# Patient Record
Sex: Male | Born: 1975 | Race: White | Hispanic: No | Marital: Married | Smoking: Current every day smoker
Health system: Southern US, Community
[De-identification: ages and names within clinical notes are randomized; demographics above are authoritative.]

## PROBLEM LIST (undated history)

## (undated) DIAGNOSIS — M549 Dorsalgia, unspecified: Secondary | ICD-10-CM

## (undated) DIAGNOSIS — I10 Essential (primary) hypertension: Secondary | ICD-10-CM

## (undated) DIAGNOSIS — F419 Anxiety disorder, unspecified: Secondary | ICD-10-CM

## (undated) DIAGNOSIS — F32A Depression, unspecified: Secondary | ICD-10-CM

## (undated) HISTORY — PX: VASCULAR SURGERY: SHX849

---

## 2020-09-16 ENCOUNTER — Other Ambulatory Visit: Payer: Self-pay

## 2020-09-16 ENCOUNTER — Emergency Department (HOSPITAL_BASED_OUTPATIENT_CLINIC_OR_DEPARTMENT_OTHER): Payer: 59

## 2020-09-16 ENCOUNTER — Emergency Department (HOSPITAL_BASED_OUTPATIENT_CLINIC_OR_DEPARTMENT_OTHER)
Admission: EM | Admit: 2020-09-16 | Discharge: 2020-09-16 | Disposition: A | Discharge status: Home / Self Care | Payer: 59 | Attending: Emergency Medicine | Admitting: Emergency Medicine

## 2020-09-16 ENCOUNTER — Encounter (HOSPITAL_BASED_OUTPATIENT_CLINIC_OR_DEPARTMENT_OTHER): Payer: Self-pay | Admitting: Emergency Medicine

## 2020-09-16 DIAGNOSIS — M25561 Pain in right knee: Secondary | ICD-10-CM

## 2020-09-16 DIAGNOSIS — Y92007 Garden or yard of unspecified non-institutional (private) residence as the place of occurrence of the external cause: Secondary | ICD-10-CM | POA: Diagnosis not present

## 2020-09-16 DIAGNOSIS — Y93H2 Activity, gardening and landscaping: Secondary | ICD-10-CM | POA: Diagnosis not present

## 2020-09-16 DIAGNOSIS — W19XXXA Unspecified fall, initial encounter: Secondary | ICD-10-CM | POA: Insufficient documentation

## 2020-09-16 DIAGNOSIS — F17200 Nicotine dependence, unspecified, uncomplicated: Secondary | ICD-10-CM | POA: Diagnosis not present

## 2020-09-16 DIAGNOSIS — I10 Essential (primary) hypertension: Secondary | ICD-10-CM | POA: Diagnosis not present

## 2020-09-16 HISTORY — DX: Depression, unspecified: F32.A

## 2020-09-16 HISTORY — DX: Essential (primary) hypertension: I10

## 2020-09-16 HISTORY — DX: Anxiety disorder, unspecified: F41.9

## 2020-09-16 MED ORDER — OXYCODONE-ACETAMINOPHEN 5-325 MG PO TABS
1.0000 | ORAL_TABLET | Freq: Once | ORAL | Status: AC
Start: 2020-09-16 — End: 2020-09-16
  Administered 2020-09-16: 1 via ORAL
  Filled 2020-09-16: qty 1

## 2020-09-16 NOTE — ED Triage Notes (Signed)
Fell yesterday and states hurt rt knee , states it bent the wrong way , walks with cane anyway

## 2020-09-16 NOTE — ED Provider Notes (Signed)
[facility] EMERGENCY DEPARTMENT Provider Note   CSN: [id number] Arrival date & time: 09/16/20  5170     History Chief Complaint  Patient presents with  . Fall  . Knee Pain    Larry Hendrix is a 45 y.o. male anxiety, depression, hypertension, chronic low back pain.  Presents with a chief complaint of right knee pain.  Patient reports that she fell yesterday while doing yard work outside.  Patient reports that he felt his right knee hyperextend and felt a "pop."  Patient's pain has been constant since this fall.  Patient rates his pain as a 6/10 on the pain scale.  Patient's pain is worse with ambulation or movement.  Patient denies any alleviating factors.  Patient has been ambulatory since his fall.    He denies any loss of consciousness or hitting his head during fall.  Patient reports history of chronic low back pain.  Denies any new neck or back pain.  Reports "pins-and-needles sensation," in both of his lower legs since having a "surgery on his veins," 2 years prior.  Patient denies any new numbness or tingling to extremities.  Patient denies any weakness extremities.  Patient denies any saddle anesthesia, bowel or bladder dysfunction.    HPI     Past Medical History:  Diagnosis Date  . Anxiety   . Depression   . Hypertension     There are no problems to display for this patient.      No family history on file.  Social History   Tobacco Use  . Smoking status: Current Every Day Smoker  . Smokeless tobacco: Never Used  Substance Use Topics  . Alcohol use: Never  . Drug use: Never    Home Medications Prior to Admission medications   Not on File    Allergies    Patient has no known allergies.  Review of Systems   Review of Systems  Constitutional: Negative for chills and fever.  Cardiovascular: Negative for leg swelling.  Musculoskeletal: Positive for arthralgias and back pain (baseline). Negative for neck pain.  Skin: Negative for color  change and wound.  Neurological: Positive for numbness (baseline). Negative for syncope, weakness, light-headedness and headaches.    Physical Exam Updated Vital Signs BP 135/75   Pulse 78   Temp 98.1 F (36.7 C) (Oral)   Resp 20   Ht 6\' 4"  (1.93 m)   Wt (!) 182.8 kg   SpO2 100%   BMI 49.05 kg/m   Physical Exam Vitals and nursing note reviewed.  Constitutional:      General: He is not in acute distress.    Appearance: He is obese. He is not ill-appearing, toxic-appearing or diaphoretic.  HENT:     Head: Normocephalic and atraumatic.  Eyes:     General: No scleral icterus.       Right eye: No discharge.        Left eye: No discharge.  Cardiovascular:     Rate and Rhythm: Normal rate.  Pulmonary:     Effort: Pulmonary effort is normal.  Musculoskeletal:     Cervical back: Normal range of motion and neck supple. No swelling, edema, deformity, erythema, signs of trauma, rigidity, spasms, torticollis, tenderness or crepitus. No pain with movement, spinous process tenderness or muscular tenderness. Normal range of motion.     Thoracic back: No swelling, deformity, signs of trauma, tenderness or bony tenderness.     Lumbar back: No swelling, edema, deformity, signs of trauma, lacerations, spasms, tenderness  or bony tenderness.     Right upper leg: Normal.     Left upper leg: Normal.     Right knee: No swelling, deformity, effusion, erythema, ecchymosis, lacerations or bony tenderness. Decreased range of motion (decreased flexion). Tenderness present over the lateral joint line. No medial joint line or patellar tendon tenderness. Normal alignment.     Left knee: No swelling, deformity, effusion, erythema, ecchymosis, lacerations or bony tenderness. Normal range of motion. No tenderness. Normal alignment.     Right lower leg: No swelling, deformity, lacerations, tenderness or bony tenderness. No edema.     Right ankle: No swelling, deformity, ecchymosis or lacerations. No tenderness.  Normal range of motion.     Right Achilles Tendon: No tenderness or defects.     Right foot: Normal range of motion and normal capillary refill. No swelling, deformity, laceration, tenderness or bony tenderness. Normal pulse.  Skin:    General: Skin is warm and dry.  Neurological:     General: No focal deficit present.     Mental Status: He is alert.  Psychiatric:        Behavior: Behavior is cooperative.     ED Results / Procedures / Treatments   Labs (all labs ordered are listed, but only abnormal results are displayed) Labs Reviewed - No data to display  EKG None  Radiology DG Knee Complete 4 Views Right  Result Date: 09/16/2020 CLINICAL DATA:  Cough pain EXAM: RIGHT KNEE - COMPLETE 4+ VIEW COMPARISON:  None. FINDINGS: No evidence of fracture, dislocation, or joint effusion. No evidence of arthropathy or other focal bone abnormality. Soft tissues are unremarkable. IMPRESSION: Negative. Electronically Signed   By: Acquanetta Belling M.D.   On: 09/16/2020 10:49    Procedures Procedures   Medications Ordered in ED Medications  oxyCODONE-acetaminophen (PERCOCET/ROXICET) 5-325 MG per tablet 1 tablet (1 tablet Oral Given 09/16/20 1017)    ED Course  I have reviewed the triage vital signs and the nursing notes.  Pertinent labs & imaging results that were available during my care of the patient were reviewed by me and considered in my medical decision making (see chart for details).    MDM Rules/Calculators/A&P                          Alert 45 year old male no acute distress, nontoxic-appearing.  Patient appears uncomfortable and complains of right knee pain.  atient reports that she fell yesterday while doing yard work outside.  Patient reports that he felt his right knee hyperextend and felt a "pop."    On physical exam No swelling, deformity, effusion, erythema, ecchymosis, lacerations or bony tenderness. Decreased range of motion (decreased flexion). Tenderness present over the  lateral joint line. No medial joint line or patellar tendon tenderness. Normal alignment.   No tender spinous process, step-off, deformity, or midline tenderness to cervical, thoracic, or lumbar spine. Low suspicion for C-spine injury; patient cleared via Nexus criteria for CT spine imaging. Low suspicion for intracranial abnormality; patient cleared Via [place] CT head rule.  X-rays of right knee showed no evidence of fracture, dislocation, or joint effusion.  Soft tissues are unremarkable.  No evidence of arthropathy or other focal bone abnormality.  Patient was given Percocet for pain and reports some improvement in his pain symptoms.  Patient placed in knee immobilizer and given crutches.  Patient advised to follow-up with orthopedic physician.  Patient advised to use over-the-counter pain medication as needed for pain management.  Patient given instructions to rest, elevate, and ice knee.  Discussed results, findings, treatment and follow up. Patient advised of return precautions. Patient verbalized understanding and agreed with plan.   Final Clinical Impression(s) / ED Diagnoses Final diagnoses:  Acute pain of right knee    Rx / DC Orders ED Discharge Orders    None       Haskel Schroeder, PA-C 09/16/20 2256    Pricilla Loveless, MD 09/17/20 [phone]

## 2020-09-16 NOTE — Discharge Instructions (Addendum)
You came to the emergency department today to be evaluated for your right knee pain.  Your x-ray showed no signs of dislocations or fractures.  Due to your injury you were placed in a knee immobilizer and given crutches.  Please use your knee immobilizer anytime you are walking.  Please rest, elevate, and ice your knee as much as possible.  Please follow-up with the orthopedic doctor I have given you information for.  Please take Ibuprofen (Advil, motrin) and Tylenol (acetaminophen) to relieve your pain.    You may take up to 600 MG (3 pills) of normal strength ibuprofen every 8 hours as needed.   You make take tylenol, up to 1,000 mg (two extra strength pills) every 8 hours as needed.   It is safe to take ibuprofen and tylenol at the same time as they work differently.   Do not take more than 3,000 mg tylenol in a 24 hour period (not more than one dose every 8 hours.  Please check all medication labels as many medications such as pain and cold medications may contain tylenol.  Do not drink alcohol while taking these medications.  Do not take other NSAID'S while taking ibuprofen (such as aleve or naproxen).  Please take ibuprofen with food to decrease stomach upset.   Get help right away if: Your knee swells, and the swelling becomes worse. You cannot move your knee. You have severe pain in your knee that cannot be managed with pain medicine.

## 2020-11-11 ENCOUNTER — Emergency Department (HOSPITAL_COMMUNITY)
Admission: EM | Admit: 2020-11-11 | Discharge: 2020-11-11 | Disposition: A | Discharge status: Home / Self Care | Payer: 59 | Source: Home / Self Care | Attending: Emergency Medicine | Admitting: Emergency Medicine

## 2020-11-11 ENCOUNTER — Other Ambulatory Visit: Payer: Self-pay

## 2020-11-11 ENCOUNTER — Encounter (HOSPITAL_BASED_OUTPATIENT_CLINIC_OR_DEPARTMENT_OTHER): Payer: Self-pay

## 2020-11-11 DIAGNOSIS — F17200 Nicotine dependence, unspecified, uncomplicated: Secondary | ICD-10-CM | POA: Insufficient documentation

## 2020-11-11 DIAGNOSIS — K029 Dental caries, unspecified: Secondary | ICD-10-CM

## 2020-11-11 DIAGNOSIS — K0889 Other specified disorders of teeth and supporting structures: Secondary | ICD-10-CM

## 2020-11-11 DIAGNOSIS — L03211 Cellulitis of face: Secondary | ICD-10-CM | POA: Diagnosis not present

## 2020-11-11 DIAGNOSIS — I10 Essential (primary) hypertension: Secondary | ICD-10-CM | POA: Insufficient documentation

## 2020-11-11 DIAGNOSIS — A419 Sepsis, unspecified organism: Secondary | ICD-10-CM | POA: Diagnosis not present

## 2020-11-11 MED ORDER — KETOROLAC TROMETHAMINE 30 MG/ML IJ SOLN
60.0000 mg | Freq: Once | INTRAMUSCULAR | Status: AC
Start: 2020-11-11 — End: 2020-11-11
  Administered 2020-11-11: 60 mg via INTRAMUSCULAR
  Filled 2020-11-11: qty 2

## 2020-11-11 MED ORDER — AMOXICILLIN 500 MG PO CAPS: 500.0000 mg | ORAL_CAPSULE | Freq: Three times a day (TID) | ORAL | 0 refills | Status: DC

## 2020-11-11 NOTE — Discharge Instructions (Addendum)
Please pick up prescription and take as prescribed to cover for an infection.  Follow up with your dentist on Monday for further evaluation.  Continue taking Ibuprofen and Tylenol as needed for pain. You can apply cold compresses to the area to help with swelling.   Return to the ED for any worsening symptoms

## 2020-11-11 NOTE — ED Provider Notes (Signed)
[facility] EMERGENCY DEPARTMENT Provider Note   CSN: [id number] Arrival date & time: 11/11/20  0946     History Chief Complaint  Patient presents with  . Dental Pain    Larry Hendrix is a 45 y.o. male with PMhx HTN, anxiety, and depression who presents to the ED today with complaint of gradual onset, constant, sharp, left lower dental pain for the past couple of days.  Patient reports that about 1 year ago the filling out of the same tooth fell out.  It was not bothering him until several days ago.  He also complains of some left-sided facial swelling.  He has an appointment with a dentist on Monday however states the pain is unbearable prompting him to come to the ED today as his PCP could not see him until June.  He has been taking multiple over-the-counter medications including ibuprofen, Tylenol, powder as well as tramadol which she takes for chronic pain.  Patient also had an old prescription for Percocet 10-3 25 which she took last night with mild relief.  He does admit that he is concerned that he could be hooked on narcotics in the future and wants to avoid these at all costs.  He wanted to make sure that he did not require antibiotics today prior to going to see the dentist on Monday as he likely needs his tooth pulled and he knows that they would not pull it if it is infected.  He denies any fevers, chills, foul taste in mouth.  He does report he is having some pain with swallowing however able to tolerate his secretions without difficulty.  No trismus.  No other complaints.   The history is provided by the patient and medical records.       Past Medical History:  Diagnosis Date  . Anxiety   . Depression   . Hypertension     There are no problems to display for this patient.   History reviewed. No pertinent surgical history.     History reviewed. No pertinent family history.  Social History   Tobacco Use  . Smoking status: Current Every Day Smoker  .  Smokeless tobacco: Never Used  Substance Use Topics  . Alcohol use: Never  . Drug use: Never    Home Medications Prior to Admission medications   Medication Sig Start Date End Date Taking? Authorizing Provider  amoxicillin (AMOXIL) 500 MG capsule Take 1 capsule (500 mg total) by mouth 3 (three) times daily. 11/11/20  Yes Hyman Hopes, Virgilio Broadhead, PA-C    Allergies    Patient has no known allergies.  Review of Systems   Review of Systems  Constitutional: Negative for chills and fever.  HENT: Positive for dental problem and facial swelling. Negative for drooling.   All other systems reviewed and are negative.   Physical Exam Updated Vital Signs BP (!) 124/91   Pulse 77   Temp 98.5 F (36.9 C) (Oral)   Resp 16   Ht 6\' 3"  (1.905 m)   Wt (!) 182.8 kg   SpO2 98%   BMI 50.37 kg/m   Physical Exam Vitals and nursing note reviewed.  Constitutional:      Appearance: He is not ill-appearing.  HENT:     Head: Normocephalic and atraumatic.     Mouth/Throat:     Comments: Nose clear.  L lower tooth #19 decayed with caries with TTP, with minimal surrounding gingival swelling and erythema, no definite abscess, no evidence of ludwig's.  Oropharynx clear and  moist, without uvular swelling or deviation, no trismus or drooling, no tonsillar swelling or erythema, no exudates.   Eyes:     Conjunctiva/sclera: Conjunctivae normal.  Cardiovascular:     Rate and Rhythm: Normal rate and regular rhythm.     Pulses: Normal pulses.     Heart sounds: No murmur heard.   Pulmonary:     Effort: Pulmonary effort is normal.     Breath sounds: Normal breath sounds.  Skin:    General: Skin is warm and dry.     Coloration: Skin is not jaundiced.  Neurological:     Mental Status: He is alert.     ED Results / Procedures / Treatments   Labs (all labs ordered are listed, but only abnormal results are displayed) Labs Reviewed - No data to display  EKG None  Radiology No results  found.  Procedures Procedures   Medications Ordered in ED Medications  ketorolac (TORADOL) 30 MG/ML injection 60 mg (60 mg Intramuscular Given 11/11/20 1253)    ED Course  I have reviewed the triage vital signs and the nursing notes.  Pertinent labs & imaging results that were available during my care of the patient were reviewed by me and considered in my medical decision making (see chart for details).    MDM Rules/Calculators/A&P                          45 year old male who presents to the ED today with complaints of left lower dental pain for the past 2 to 3 days.  Has an appointment with a dentist on Monday however continues to have pain and facial swelling on the left side prompting him to come to the ED today.  On arrival to the ED vitals are stable.  Patient appears to be in no acute distress.  He does have some mild left-sided facial swelling without signs of Ludwig's angina at this time.  He also has dental caries noted to tooth #19 with some gingival erythema.  No definitive dental abscess to be drained at this time.  I do feel patient would benefit from antibiotics today.  Have offered 1 dose of pain medication in the ED however patient states that he would like to refrain from narcotics at this time.  We will give a shot of Toradol and discharged.  Patient instructed to follow-up with dentist on Monday.  He is in agreement plan and stable for discharge   This note was prepared using Dragon voice recognition software and may include unintentional dictation errors due to the inherent limitations of voice recognition software.  Final Clinical Impression(s) / ED Diagnoses Final diagnoses:  Pain, dental  Dental caries    Rx / DC Orders ED Discharge Orders         Ordered    amoxicillin (AMOXIL) 500 MG capsule  3 times daily        11/11/20 1256           Discharge Instructions     Please pick up prescription and take as prescribed to cover for an infection.  Follow  up with your dentist on Monday for further evaluation.  Continue taking Ibuprofen and Tylenol as needed for pain. You can apply cold compresses to the area to help with swelling.   Return to the ED for any worsening symptoms       Tanda Rockers, PA-C 11/11/20 1300    Tegeler, Canary Brim, MD 11/11/20 1558

## 2020-11-11 NOTE — ED Triage Notes (Signed)
Pt reports he is supposed to get a tooth pulled on Monday but woke up today and his bottom left jaw was swollen. 10/10 pain.

## 2020-11-12 ENCOUNTER — Inpatient Hospital Stay (HOSPITAL_BASED_OUTPATIENT_CLINIC_OR_DEPARTMENT_OTHER)
Admission: EM | Admit: 2020-11-12 | Discharge: 2020-11-16 | DRG: 872 | Disposition: A | Discharge status: Home / Self Care | Payer: 59 | Attending: Internal Medicine | Admitting: Internal Medicine

## 2020-11-12 ENCOUNTER — Other Ambulatory Visit: Payer: Self-pay

## 2020-11-12 ENCOUNTER — Encounter (HOSPITAL_BASED_OUTPATIENT_CLINIC_OR_DEPARTMENT_OTHER): Payer: Self-pay

## 2020-11-12 ENCOUNTER — Emergency Department (HOSPITAL_BASED_OUTPATIENT_CLINIC_OR_DEPARTMENT_OTHER): Payer: 59

## 2020-11-12 DIAGNOSIS — K219 Gastro-esophageal reflux disease without esophagitis: Secondary | ICD-10-CM | POA: Diagnosis present

## 2020-11-12 DIAGNOSIS — I10 Essential (primary) hypertension: Secondary | ICD-10-CM | POA: Diagnosis present

## 2020-11-12 DIAGNOSIS — M549 Dorsalgia, unspecified: Secondary | ICD-10-CM | POA: Diagnosis present

## 2020-11-12 DIAGNOSIS — K0889 Other specified disorders of teeth and supporting structures: Secondary | ICD-10-CM | POA: Diagnosis present

## 2020-11-12 DIAGNOSIS — F17210 Nicotine dependence, cigarettes, uncomplicated: Secondary | ICD-10-CM | POA: Diagnosis present

## 2020-11-12 DIAGNOSIS — L03211 Cellulitis of face: Secondary | ICD-10-CM | POA: Diagnosis present

## 2020-11-12 DIAGNOSIS — K047 Periapical abscess without sinus: Secondary | ICD-10-CM | POA: Diagnosis present

## 2020-11-12 DIAGNOSIS — G8929 Other chronic pain: Secondary | ICD-10-CM | POA: Diagnosis present

## 2020-11-12 DIAGNOSIS — Z6841 Body Mass Index (BMI) 40.0 and over, adult: Secondary | ICD-10-CM

## 2020-11-12 DIAGNOSIS — Z20822 Contact with and (suspected) exposure to covid-19: Secondary | ICD-10-CM | POA: Diagnosis present

## 2020-11-12 DIAGNOSIS — A419 Sepsis, unspecified organism: Principal | ICD-10-CM | POA: Diagnosis present

## 2020-11-12 DIAGNOSIS — F418 Other specified anxiety disorders: Secondary | ICD-10-CM

## 2020-11-12 DIAGNOSIS — F17200 Nicotine dependence, unspecified, uncomplicated: Secondary | ICD-10-CM | POA: Diagnosis present

## 2020-11-12 DIAGNOSIS — E871 Hypo-osmolality and hyponatremia: Secondary | ICD-10-CM

## 2020-11-12 DIAGNOSIS — K029 Dental caries, unspecified: Secondary | ICD-10-CM | POA: Diagnosis present

## 2020-11-12 LAB — COMPREHENSIVE METABOLIC PANEL
ALT: 32 U/L (ref 0–44)
AST: 19 U/L (ref 15–41)
Albumin: 3.6 g/dL (ref 3.5–5.0)
Alkaline Phosphatase: 71 U/L (ref 38–126)
Anion gap: 10 (ref 5–15)
BUN: 15 mg/dL (ref 6–20)
CO2: 24 mmol/L (ref 22–32)
Calcium: 8.2 mg/dL — ABNORMAL LOW (ref 8.9–10.3)
Chloride: 98 mmol/L (ref 98–111)
Creatinine, Ser: 1.17 mg/dL (ref 0.61–1.24)
GFR, Estimated: >60 mL/min (ref >60)
Glucose, Bld: 133 mg/dL — ABNORMAL HIGH (ref 70–99)
Potassium: 3.8 mmol/L (ref 3.5–5.1)
Sodium: 132 mmol/L — ABNORMAL LOW (ref 135–145)
Total Bilirubin: 1.4 mg/dL — ABNORMAL HIGH (ref 0.3–1.2)
Total Protein: 7.1 g/dL (ref 6.5–8.1)

## 2020-11-12 LAB — CBC WITH DIFFERENTIAL/PLATELET
Abs Immature Granulocytes: 0.06 10*3/uL (ref 0.00–0.07)
Basophils Absolute: 0 10*3/uL (ref 0.0–0.1)
Basophils Relative: 0 %
Eosinophils Absolute: 0 10*3/uL (ref 0.0–0.5)
Eosinophils Relative: 0 %
HCT: 42.8 % (ref 39.0–52.0)
Hemoglobin: 14.5 g/dL (ref 13.0–17.0)
Immature Granulocytes: 0 %
Lymphocytes Relative: 10 %
Lymphs Abs: 1.4 10*3/uL (ref 0.7–4.0)
MCH: 31.2 pg (ref 26.0–34.0)
MCHC: 33.9 g/dL (ref 30.0–36.0)
MCV: 92 fL (ref 80.0–100.0)
Monocytes Absolute: 1 10*3/uL (ref 0.1–1.0)
Monocytes Relative: 7 %
Neutro Abs: 11 10*3/uL — ABNORMAL HIGH (ref 1.7–7.7)
Neutrophils Relative %: 83 %
Platelets: 149 10*3/uL — ABNORMAL LOW (ref 150–400)
RBC: 4.65 MIL/uL (ref 4.22–5.81)
RDW: 12.4 % (ref 11.5–15.5)
WBC: 13.4 10*3/uL — ABNORMAL HIGH (ref 4.0–10.5)
nRBC: 0 % (ref 0.0–0.2)

## 2020-11-12 LAB — URINALYSIS, ROUTINE W REFLEX MICROSCOPIC
Bilirubin Urine: NEGATIVE
Glucose, UA: NEGATIVE mg/dL
Ketones, ur: NEGATIVE mg/dL
Leukocytes,Ua: NEGATIVE
Nitrite: NEGATIVE
Protein, ur: NEGATIVE mg/dL
Specific Gravity, Urine: 1.02 (ref 1.005–1.030)
pH: 6 (ref 5.0–8.0)

## 2020-11-12 LAB — RESP PANEL BY RT-PCR (FLU A&B, COVID) ARPGX2
Influenza A by PCR: NEGATIVE
Influenza B by PCR: NEGATIVE
SARS Coronavirus 2 by RT PCR: NEGATIVE

## 2020-11-12 LAB — PROTIME-INR
INR: 1.2 (ref 0.8–1.2)
Prothrombin Time: 15.1 seconds (ref 11.4–15.2)

## 2020-11-12 LAB — LACTIC ACID, PLASMA: Lactic Acid, Venous: 0.9 mmol/L (ref 0.5–1.9)

## 2020-11-12 LAB — URINALYSIS, MICROSCOPIC (REFLEX): WBC, UA: NONE SEEN WBC/hpf (ref 0–5)

## 2020-11-12 LAB — APTT: aPTT: 34 seconds (ref 24–36)

## 2020-11-12 MED ORDER — LACTATED RINGERS IV BOLUS (SEPSIS)
1000.0000 mL | Freq: Once | INTRAVENOUS | Status: AC
  Administered 2020-11-12: 1000 mL via INTRAVENOUS

## 2020-11-12 MED ORDER — IOHEXOL 300 MG/ML  SOLN
100.0000 mL | Freq: Once | INTRAMUSCULAR | Status: AC
  Administered 2020-11-12: 100 mL via INTRAVENOUS

## 2020-11-12 MED ORDER — HYDROMORPHONE HCL 1 MG/ML IJ SOLN
1.0000 mg | Freq: Once | INTRAMUSCULAR | Status: AC
  Administered 2020-11-12: 1 mg via INTRAVENOUS
  Filled 2020-11-12: qty 1

## 2020-11-12 MED ORDER — SODIUM CHLORIDE 0.9 % IV SOLN
2.0000 g | Freq: Once | INTRAVENOUS | Status: AC
  Administered 2020-11-12: 2 g via INTRAVENOUS
  Filled 2020-11-12: qty 2

## 2020-11-12 MED ORDER — VANCOMYCIN HCL IN DEXTROSE 1-5 GM/200ML-% IV SOLN
1000.0000 mg | Freq: Two times a day (BID) | INTRAVENOUS | Status: AC
Start: 2020-11-12 — End: 2020-11-13
  Administered 2020-11-12 – 2020-11-13 (×2): 1000 mg via INTRAVENOUS
  Filled 2020-11-12 (×4): qty 200

## 2020-11-12 MED ORDER — LACTATED RINGERS IV SOLN: INTRAVENOUS | Status: AC

## 2020-11-12 MED ORDER — VANCOMYCIN HCL IN DEXTROSE 1-5 GM/200ML-% IV SOLN: 1000.0000 mg | Freq: Once | INTRAVENOUS | Status: DC

## 2020-11-12 MED ORDER — VANCOMYCIN HCL IN DEXTROSE 1-5 GM/200ML-% IV SOLN
1000.0000 mg | Freq: Two times a day (BID) | INTRAVENOUS | Status: AC
  Administered 2020-11-12 – 2020-11-13 (×2): 1000 mg via INTRAVENOUS
  Filled 2020-11-12 (×2): qty 200

## 2020-11-12 MED ORDER — MORPHINE SULFATE (PF) 4 MG/ML IV SOLN
4.0000 mg | Freq: Once | INTRAVENOUS | Status: AC
  Administered 2020-11-12: 4 mg via INTRAVENOUS
  Filled 2020-11-12: qty 1

## 2020-11-12 MED ORDER — METRONIDAZOLE 500 MG/100ML IV SOLN
500.0000 mg | Freq: Once | INTRAVENOUS | Status: AC
  Administered 2020-11-12: 500 mg via INTRAVENOUS
  Filled 2020-11-12: qty 100

## 2020-11-12 MED ORDER — SODIUM CHLORIDE 0.9 % IV SOLN
2.0000 g | Freq: Three times a day (TID) | INTRAVENOUS | Status: DC
  Administered 2020-11-13 – 2020-11-15 (×7): 2 g via INTRAVENOUS
  Filled 2020-11-12 (×8): qty 2

## 2020-11-12 MED ORDER — VANCOMYCIN HCL IN DEXTROSE 1-5 GM/200ML-% IV SOLN
1000.0000 mg | Freq: Once | INTRAVENOUS | Status: DC
  Filled 2020-11-12: qty 200

## 2020-11-12 NOTE — ED Notes (Signed)
ED Provider at bedside. 

## 2020-11-12 NOTE — ED Notes (Signed)
Pt is aware he needs a urine sample. Pt has been given a urinal 

## 2020-11-12 NOTE — ED Notes (Signed)
Called to verify Vancomycin order with [facility] that order is for 2 gram.  Order verified.

## 2020-11-12 NOTE — Progress Notes (Signed)
Pharmacy Antibiotic Note  Larry Hendrix is a 45 y.o. male admitted on 11/12/2020 with sepsis.  Pharmacy has been consulted for vancomycin and cefepime dosing.  Plan: Metronidazole 500mg  IV x1 per MD Cefepime 2g IV q8h Vancomycin 2g IV q12h (est AUC 509)  - Monitor renal function - Monitor clinical improvement, LOT, and de-escalation   Height: 6\' 3"  (190.5 cm) Weight: (!) 182.8 kg (403 lb) IBW/kg (Calculated) : 84.5  Temp (24hrs), Avg:101.1 F (38.4 C), Min:101.1 F (38.4 C), Max:101.1 F (38.4 C)  No results for input(s): WBC, CREATININE, LATICACIDVEN, VANCOTROUGH, VANCOPEAK, VANCORANDOM, GENTTROUGH, GENTPEAK, GENTRANDOM, TOBRATROUGH, TOBRAPEAK, TOBRARND, AMIKACINPEAK, AMIKACINTROU, AMIKACIN in the last 168 hours.  CrCl cannot be calculated (No successful lab value found.).    No Known Allergies  Antimicrobials this admission: Metronidazole 4/29 >> Vancomycin 4/29 >> Cefepime 4/29 >>   Dose adjustments this admission:  Microbiology results: 4/29 Bcx: 4/29 Ucx:   Thank you for allowing pharmacy to be a part of this patient's care.  5/29, PharmD PGY1 Acute Care Pharmacy Resident 11/12/2020 7:18 PM  Please check [email or web address] for unit-specific pharmacy phone numbers.
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
## 2020-11-12 NOTE — ED Notes (Signed)
Pt. Went to radiology
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
## 2020-11-13 DIAGNOSIS — K047 Periapical abscess without sinus: Secondary | ICD-10-CM | POA: Diagnosis present

## 2020-11-13 DIAGNOSIS — F418 Other specified anxiety disorders: Secondary | ICD-10-CM | POA: Diagnosis present

## 2020-11-13 DIAGNOSIS — I10 Essential (primary) hypertension: Secondary | ICD-10-CM | POA: Diagnosis present

## 2020-11-13 DIAGNOSIS — K0889 Other specified disorders of teeth and supporting structures: Secondary | ICD-10-CM | POA: Diagnosis present

## 2020-11-13 DIAGNOSIS — E871 Hypo-osmolality and hyponatremia: Secondary | ICD-10-CM

## 2020-11-13 DIAGNOSIS — M549 Dorsalgia, unspecified: Secondary | ICD-10-CM | POA: Diagnosis present

## 2020-11-13 DIAGNOSIS — F17200 Nicotine dependence, unspecified, uncomplicated: Secondary | ICD-10-CM | POA: Diagnosis present

## 2020-11-13 DIAGNOSIS — K029 Dental caries, unspecified: Secondary | ICD-10-CM | POA: Diagnosis present

## 2020-11-13 DIAGNOSIS — G8929 Other chronic pain: Secondary | ICD-10-CM | POA: Diagnosis present

## 2020-11-13 DIAGNOSIS — L03211 Cellulitis of face: Secondary | ICD-10-CM | POA: Diagnosis present

## 2020-11-13 DIAGNOSIS — A419 Sepsis, unspecified organism: Secondary | ICD-10-CM | POA: Diagnosis present

## 2020-11-13 DIAGNOSIS — Z20822 Contact with and (suspected) exposure to covid-19: Secondary | ICD-10-CM | POA: Diagnosis present

## 2020-11-13 DIAGNOSIS — Z6841 Body Mass Index (BMI) 40.0 and over, adult: Secondary | ICD-10-CM | POA: Diagnosis not present

## 2020-11-13 DIAGNOSIS — F17210 Nicotine dependence, cigarettes, uncomplicated: Secondary | ICD-10-CM | POA: Diagnosis present

## 2020-11-13 DIAGNOSIS — K219 Gastro-esophageal reflux disease without esophagitis: Secondary | ICD-10-CM | POA: Diagnosis present

## 2020-11-13 LAB — CBC
HCT: 40.3 % (ref 39.0–52.0)
Hemoglobin: 13.5 g/dL (ref 13.0–17.0)
MCH: 31 pg (ref 26.0–34.0)
MCHC: 33.5 g/dL (ref 30.0–36.0)
MCV: 92.6 fL (ref 80.0–100.0)
Platelets: 126 10*3/uL — ABNORMAL LOW (ref 150–400)
RBC: 4.35 MIL/uL (ref 4.22–5.81)
RDW: 12.2 % (ref 11.5–15.5)
WBC: 7.1 10*3/uL (ref 4.0–10.5)
nRBC: 0 % (ref 0.0–0.2)

## 2020-11-13 LAB — HIV ANTIBODY (ROUTINE TESTING W REFLEX): HIV Screen 4th Generation wRfx: NONREACTIVE

## 2020-11-13 LAB — COMPREHENSIVE METABOLIC PANEL
ALT: 28 U/L (ref 0–44)
AST: 20 U/L (ref 15–41)
Albumin: 3.2 g/dL — ABNORMAL LOW (ref 3.5–5.0)
Alkaline Phosphatase: 62 U/L (ref 38–126)
Anion gap: 7 (ref 5–15)
BUN: 12 mg/dL (ref 6–20)
CO2: 26 mmol/L (ref 22–32)
Calcium: 8.2 mg/dL — ABNORMAL LOW (ref 8.9–10.3)
Chloride: 102 mmol/L (ref 98–111)
Creatinine, Ser: 1.1 mg/dL (ref 0.61–1.24)
GFR, Estimated: >60 mL/min (ref >60)
Glucose, Bld: 129 mg/dL — ABNORMAL HIGH (ref 70–99)
Potassium: 3.8 mmol/L (ref 3.5–5.1)
Sodium: 135 mmol/L (ref 135–145)
Total Bilirubin: 1.1 mg/dL (ref 0.3–1.2)
Total Protein: 6.4 g/dL — ABNORMAL LOW (ref 6.5–8.1)

## 2020-11-13 LAB — HEMOGLOBIN A1C
Hgb A1c MFr Bld: 5.6 % (ref 4.8–5.6)
Mean Plasma Glucose: 114.02 mg/dL

## 2020-11-13 LAB — MAGNESIUM: Magnesium: 2 mg/dL (ref 1.7–2.4)

## 2020-11-13 LAB — LACTIC ACID, PLASMA: Lactic Acid, Venous: 0.8 mmol/L (ref 0.5–1.9)

## 2020-11-13 LAB — PROCALCITONIN: Procalcitonin: 0.57 ng/mL

## 2020-11-13 MED ORDER — CITALOPRAM HYDROBROMIDE 20 MG PO TABS
40.0000 mg | ORAL_TABLET | Freq: Every day | ORAL | Status: DC
  Administered 2020-11-13 – 2020-11-16 (×4): 40 mg via ORAL
  Filled 2020-11-13 (×4): qty 2

## 2020-11-13 MED ORDER — ALBUTEROL SULFATE HFA 108 (90 BASE) MCG/ACT IN AERS
1.0000 | INHALATION_SPRAY | Freq: Four times a day (QID) | RESPIRATORY_TRACT | Status: DC | PRN
Start: 2020-11-13 — End: 2020-11-16
  Filled 2020-11-13: qty 6.7

## 2020-11-13 MED ORDER — ACETAMINOPHEN 650 MG RE SUPP: 650.0000 mg | Freq: Four times a day (QID) | RECTAL | Status: DC | PRN

## 2020-11-13 MED ORDER — HYDROMORPHONE HCL 1 MG/ML IJ SOLN
1.0000 mg | INTRAMUSCULAR | Status: DC | PRN
Start: 2020-11-13 — End: 2020-11-16
  Administered 2020-11-15 (×2): 1 mg via INTRAVENOUS
  Filled 2020-11-13: qty 1

## 2020-11-13 MED ORDER — ENOXAPARIN SODIUM 40 MG/0.4ML IJ SOSY
40.0000 mg | PREFILLED_SYRINGE | INTRAMUSCULAR | Status: DC
  Administered 2020-11-13 – 2020-11-14 (×2): 40 mg via SUBCUTANEOUS
  Filled 2020-11-13 (×2): qty 0.4

## 2020-11-13 MED ORDER — ONDANSETRON HCL 4 MG/2ML IJ SOLN: 4.0000 mg | Freq: Four times a day (QID) | INTRAMUSCULAR | Status: DC | PRN

## 2020-11-13 MED ORDER — ACETAMINOPHEN 325 MG PO TABS
650.0000 mg | ORAL_TABLET | Freq: Four times a day (QID) | ORAL | Status: DC | PRN
  Administered 2020-11-15: 650 mg via ORAL
  Filled 2020-11-13: qty 2

## 2020-11-13 MED ORDER — ACETAMINOPHEN 650 MG RE SUPP: 650.0000 mg | Freq: Once | RECTAL | Status: DC

## 2020-11-13 MED ORDER — PANTOPRAZOLE SODIUM 40 MG PO TBEC
40.0000 mg | DELAYED_RELEASE_TABLET | Freq: Every day | ORAL | Status: DC
  Administered 2020-11-13 – 2020-11-16 (×4): 40 mg via ORAL
  Filled 2020-11-13 (×4): qty 1

## 2020-11-13 MED ORDER — METHOCARBAMOL 500 MG PO TABS
750.0000 mg | ORAL_TABLET | Freq: Four times a day (QID) | ORAL | Status: DC
  Administered 2020-11-13 – 2020-11-16 (×15): 750 mg via ORAL
  Filled 2020-11-13 (×15): qty 2

## 2020-11-13 MED ORDER — VANCOMYCIN HCL 2000 MG/400ML IV SOLN
2000.0000 mg | Freq: Two times a day (BID) | INTRAVENOUS | Status: DC
  Administered 2020-11-13 – 2020-11-15 (×4): 2000 mg via INTRAVENOUS
  Filled 2020-11-13 (×5): qty 400

## 2020-11-13 MED ORDER — PREGABALIN 75 MG PO CAPS
150.0000 mg | ORAL_CAPSULE | Freq: Two times a day (BID) | ORAL | Status: DC
  Administered 2020-11-13 – 2020-11-16 (×7): 150 mg via ORAL
  Filled 2020-11-13 (×7): qty 2

## 2020-11-13 MED ORDER — ONDANSETRON HCL 4 MG PO TABS: 4.0000 mg | ORAL_TABLET | Freq: Four times a day (QID) | ORAL | Status: DC | PRN

## 2020-11-13 MED ORDER — OXYCODONE HCL 5 MG PO TABS
5.0000 mg | ORAL_TABLET | ORAL | Status: DC | PRN
  Administered 2020-11-13 – 2020-11-15 (×6): 5 mg via ORAL
  Filled 2020-11-13 (×6): qty 1

## 2020-11-13 NOTE — H&P (Addendum)
History and Physical    Larry Hendrix FHL:[id number] DOB: [birth year] DOA: 11/12/2020  PCP: Daryll Brod, MD  Patient coming from: [facility] I have personally briefly reviewed patient's old medical records in [place]  Chief Complaint: Left facial cellulitis  HPI: Larry Hendrix is a 45 y.o. male with medical history significant of hypertension, depression with anxiety, chronic back pain, tobacco abuse presents from [place] for further evaluation and management of left facial cellulitis.  He reports that he started having left-sided facial swelling and pain associated with high-grade fever, chills, nausea, vomiting, generalized weakness, lethargy, sore throat, headache.  He was seen at [facility] day before yesterday and was given anti-inflammatory and antibiotics with no improvement.  He decided to go back to [place] due to worsening pain and swelling.  He smokes 1 pack of cigarettes per day, denies alcohol, illicit drug use.  ED Course: Upon arrival to ED he had fever of 101.5, maintaining oxygen saturation on room air, BP: 132/95, CBC shows leukocytosis of 13.4, CMP shows sodium of 132 1.4, lactic acid: WNL.  CT head negative for acute findings.  CT soft tissue concerning for left facial cellulitis.  Patient was given IV fluids, vancomycin, cefepime and metronidazole in ED and transferred to [facility] for further evaluation and management.  Review of Systems: As per HPI otherwise negative.    Past Medical History:  Diagnosis Date  . Anxiety   . Depression   . Hypertension     Past Surgical History:  Procedure Laterality Date  . VASCULAR SURGERY       reports that he has been smoking cigarettes. He has never used smokeless tobacco. He reports that he does not drink alcohol and does not use drugs.  No Known Allergies  No family history on file.  Prior to Admission medications   Medication Sig Start Date End Date Taking?  Authorizing Provider  amoxicillin (AMOXIL) 500 MG capsule Take 1 capsule (500 mg total) by mouth 3 (three) times daily. 11/11/20   Eustaquio Maize, PA-C    Physical Exam: Vitals:   11/13/20 0045 11/13/20 0343 11/13/20 0400 11/13/20 0453  BP: (!) 119/95  (!) 111/55 132/77  Pulse: 80  85 88  Resp: 20  (!) 25 18  Temp:  (!) 101.5 F (38.6 C)  100 F (37.8 C)  TempSrc:  Oral  Oral  SpO2: 100%  92% 95%  Weight:      Height:        Constitutional: NAD, calm, comfortable, on room air, morbidly obese, communicating well, appears weak and sick.   Eyes: PERRL, lids and conjunctivae normal ENMT: Left-sided facial swelling noted.  Tender on palpation, unable to fully open his mouth.  No discharge, bleeding, gum swelling noted. Respiratory: clear to auscultation bilaterally, no wheezing, no crackles. Normal respiratory effort. No accessory muscle use.  Cardiovascular: Regular rate and rhythm, no murmurs / rubs / gallops. No extremity edema. 2+ pedal pulses. No carotid bruits.  Abdomen: no tenderness, no masses palpated. No hepatosplenomegaly. Bowel sounds positive.  Musculoskeletal: no clubbing / cyanosis. No joint deformity upper and lower extremities. Good ROM, no contractures. Normal muscle tone.  Skin: no rashes, lesions, ulcers. No induration Neurologic: CN 2-12 grossly intact. Sensation intact, DTR normal. Strength 5/5 in all 4.  Psychiatric: Normal judgment and insight. Alert and oriented x 3. Normal mood.    Labs on Admission: I have personally reviewed following labs and imaging studies  CBC:  Recent Labs  Lab 11/12/20 1908  WBC 13.4*  NEUTROABS 11.0*  HGB 14.5  HCT 42.8  MCV 92.0  PLT 578*   Basic Metabolic Panel: Recent Labs  Lab 11/12/20 1908  NA 132*  K 3.8  CL 98  CO2 24  GLUCOSE 133*  BUN 15  CREATININE 1.17  CALCIUM 8.2*   GFR: Estimated Creatinine Clearance: 144 mL/min (by C-G formula based on SCr of 1.17 mg/dL). Liver Function Tests: Recent Labs  Lab  11/12/20 1908  AST 19  ALT 32  ALKPHOS 71  BILITOT 1.4*  PROT 7.1  ALBUMIN 3.6   No results for input(s): LIPASE, AMYLASE in the last 168 hours. No results for input(s): AMMONIA in the last 168 hours. Coagulation Profile: Recent Labs  Lab 11/12/20 1908  INR 1.2   Cardiac Enzymes: No results for input(s): CKTOTAL, CKMB, CKMBINDEX, TROPONINI in the last 168 hours. BNP (last 3 results) No results for input(s): PROBNP in the last 8760 hours. HbA1C: No results for input(s): HGBA1C in the last 72 hours. CBG: No results for input(s): GLUCAP in the last 168 hours. Lipid Profile: No results for input(s): CHOL, HDL, LDLCALC, TRIG, CHOLHDL, LDLDIRECT in the last 72 hours. Thyroid Function Tests: No results for input(s): TSH, T4TOTAL, FREET4, T3FREE, THYROIDAB in the last 72 hours. Anemia Panel: No results for input(s): VITAMINB12, FOLATE, FERRITIN, TIBC, IRON, RETICCTPCT in the last 72 hours. Urine analysis:    Component Value Date/Time   COLORURINE YELLOW 11/12/2020 1908   APPEARANCEUR CLEAR 11/12/2020 1908   LABSPEC 1.020 11/12/2020 1908   PHURINE 6.0 11/12/2020 1908   GLUCOSEU NEGATIVE 11/12/2020 1908   HGBUR TRACE (A) 11/12/2020 1908   BILIRUBINUR NEGATIVE 11/12/2020 1908   KETONESUR NEGATIVE 11/12/2020 1908   PROTEINUR NEGATIVE 11/12/2020 1908   NITRITE NEGATIVE 11/12/2020 1908   LEUKOCYTESUR NEGATIVE 11/12/2020 1908    Radiological Exams on Admission: CT Head Wo Contrast  Result Date: 11/12/2020 CLINICAL DATA:  Swelling of the left side of the face EXAM: CT HEAD WITHOUT CONTRAST TECHNIQUE: Contiguous axial images were obtained from the base of the skull through the vertex without intravenous contrast. COMPARISON:  None. FINDINGS: Brain: The brain shows a normal appearance without evidence of malformation, atrophy, old or acute small or large vessel infarction, mass lesion, hemorrhage, hydrocephalus or extra-axial collection. Vascular: No hyperdense vessel. No evidence of  atherosclerotic calcification. Skull: Normal.  No traumatic finding.  No focal bone lesion. Sinuses/Orbits: Sinuses are clear. Orbits appear normal. Mastoids are clear. Other: See results of neck CT. IMPRESSION: 1. Normal head CT. 2. See results of neck CT. Electronically Signed   By: Nelson Chimes M.D.   On: 11/12/2020 20:30   CT Soft Tissue Neck W Contrast  Result Date: 11/12/2020 CLINICAL DATA:  Neck abscess. Lingual induration. Anterior neck swelling. Left molar abscess. EXAM: CT NECK WITH CONTRAST TECHNIQUE: Multidetector CT imaging of the neck was performed using the standard protocol following the bolus administration of intravenous contrast. CONTRAST:  1106mL OMNIPAQUE IOHEXOL 300 MG/ML  SOLN COMPARISON:  None. FINDINGS: Pharynx and larynx: No primary mucosal or submucosal lesion identified. Tongue appears grossly normal by CT. Salivary glands: Parotid and submandibular glands are normal. Thyroid: Normal Lymph nodes: Mild reactive prominence of the lymph nodes on the left. No suppuration. Vascular: Normal Limited intracranial: Normal Visualized orbits: Normal Mastoids and visualized paranasal sinuses: Clear Skeleton: Maxillary dentition appears normal. Mandibular dentition appears normal except for to 19 which shows a filling with some adjacent lucency. No evidence of  root disease. Upper chest: Negative Other: Left facial edema consistent with facial cellulitis. Swelling in the region of the gingiva adjacent to the left mandible. As noted above, there does not appear to be advanced periodontal disease by imaging however. No sign of a drainable low-density collection. IMPRESSION: Left facial cellulitis. Swelling in the region of the gingiva adjacent to the left mandible. No sign of a drainable low-density collection. No evidence of advanced periodontal disease. Dental filling tooth 19 with some adjacent lucency. Electronically Signed   By: Nelson Chimes M.D.   On: 11/12/2020 20:30   DG Chest Port 1  View  Result Date: 11/12/2020 CLINICAL DATA:  Questionable sepsis. EXAM: PORTABLE CHEST 1 VIEW COMPARISON:  Chest x-ray dated 04/22/2020 FINDINGS: Borderline cardiomegaly, stable. Lungs appear clear. No pleural effusion or pneumothorax is seen. Osseous structures about the chest are unremarkable. IMPRESSION: No active disease. No evidence of pneumonia or pulmonary edema. Electronically Signed   By: Franki Cabot M.D.   On: 11/12/2020 20:44     Assessment/Plan Principal Problem:   Sepsis ([place]) Active Problems:   Facial cellulitis   Hypertension   Tobacco use disorder   Morbid obesity ([place])   Hyponatremia   Depression with anxiety   Sepsis in the setting of left facial cellulitis: -Patient met sepsis criteria on admission with fever of 101 and leukocytosis of 13.4.  Lactic acid: WNL, COVID-19 negative, -Reviewed CT head, CT soft tissue and chest x-ray. -Patient started on IV fluids, cefepime, metronidazole and vancomycin -Continue IV fluids, cefepime and vancomycin. -Blood culture is obtained and is pending.  Check A1c.  Repeat labs. -Continue as needed pain medications -We will keep him n.p.o.  On continuous pulse ox.  Hypertension: Currently blood pressure is stable to lower side. -Continue to hold antihypertensive medication-amlodipine, Coreg, HCTZ, losartan at this time.  Continue IV fluids.  Monitor blood pressure closely  Depression with anxiety: Continue Celexa  Chronic back pain: Continue Robaxin and Lyrica  GERD: Continue PPI  Hyponatremia: Sodium 132. -Continue IV fluids.  Repeat BMP  Tobacco abuse: -Smokes 1 pack of cigarettes per day.  Counseled about cessation.  Refused nicotine patch.  Morbid obesity with BMI of 50: -Diet modification/exercise and weight loss recommended  DVT prophylaxis: Lovenox Code Status: SCD Family Communication: None present at bedside.  Plan of care discussed with patient in length and he verbalized understanding and agreed with  it. Disposition Plan: To be determined Consults called: None Admission status: Inpatient   Mckinley Jewel MD [facility]  If 7PM-7AM, please contact night-coverage [email or web address]  11/13/2020, 9:17 AM

## 2020-11-13 NOTE — Plan of Care (Signed)
  Problem: Clinical Measurements: Goal: Ability to maintain clinical measurements within normal limits will improve Outcome: Progressing Goal: Will remain free from infection Outcome: Progressing Goal: Respiratory complications will improve Outcome: Progressing   

## 2020-11-13 NOTE — Plan of Care (Signed)
?  Problem: Activity: ?Goal: Risk for activity intolerance will decrease ?Outcome: Progressing ?  ?Problem: Safety: ?Goal: Ability to remain free from injury will improve ?Outcome: Progressing ?  ?Problem: Pain Managment: ?Goal: General experience of comfort will improve ?Outcome: Progressing ?  ?

## 2020-11-13 NOTE — Progress Notes (Signed)
Code Sepsis completion note: Pt had BC drawn before ABX administered. LA 0.9. Received approx 1600 ml of allotted 5,400 +. Sepsis due to tooth abscess with inflammation that spread to the neck.

## 2020-11-14 DIAGNOSIS — F418 Other specified anxiety disorders: Secondary | ICD-10-CM

## 2020-11-14 DIAGNOSIS — L03211 Cellulitis of face: Secondary | ICD-10-CM

## 2020-11-14 DIAGNOSIS — I10 Essential (primary) hypertension: Secondary | ICD-10-CM

## 2020-11-14 DIAGNOSIS — A419 Sepsis, unspecified organism: Principal | ICD-10-CM

## 2020-11-14 DIAGNOSIS — F17200 Nicotine dependence, unspecified, uncomplicated: Secondary | ICD-10-CM

## 2020-11-14 LAB — CBC
HCT: 41.1 % (ref 39.0–52.0)
Hemoglobin: 13.6 g/dL (ref 13.0–17.0)
MCH: 30.8 pg (ref 26.0–34.0)
MCHC: 33.1 g/dL (ref 30.0–36.0)
MCV: 93.2 fL (ref 80.0–100.0)
Platelets: 132 10*3/uL — ABNORMAL LOW (ref 150–400)
RBC: 4.41 MIL/uL (ref 4.22–5.81)
RDW: 12 % (ref 11.5–15.5)
WBC: 6.5 10*3/uL (ref 4.0–10.5)
nRBC: 0 % (ref 0.0–0.2)

## 2020-11-14 LAB — PROCALCITONIN: Procalcitonin: 0.43 ng/mL

## 2020-11-14 LAB — CORTISOL-AM, BLOOD: Cortisol - AM: 12.2 ug/dL (ref 6.7–22.6)

## 2020-11-14 LAB — URINE CULTURE: Culture: NO GROWTH

## 2020-11-14 LAB — PROTIME-INR
INR: 1.2 (ref 0.8–1.2)
Prothrombin Time: 15.3 seconds — ABNORMAL HIGH (ref 11.4–15.2)

## 2020-11-14 NOTE — Progress Notes (Addendum)
PROGRESS NOTE    Larry Hendrix  WLS:[id number] DOB: 1975/10/18 DOA: 11/12/2020 PCP: Daryll Brod, MD   Brief Narrative:  Larry Hendrix is a 45 y.o. male with medical history significant of hypertension, depression with anxiety, chronic back pain, tobacco abuse presents from [facility] for further evaluation and management of left facial cellulitis.  ED Course: Upon arrival to ED he had fever of 101.5, maintaining oxygen saturation on room air, BP: 132/95, CBC shows leukocytosis of 13.4, CMP shows sodium of 132 1.4, lactic acid: WNL.  CT head negative for acute findings.  CT soft tissue concerning for left facial cellulitis.  Patient was given IV fluids, vancomycin, cefepime and metronidazole in ED and transferred to [facility] for further evaluation and management.  Assessment & Plan:   Sepsis in the setting of left facial cellulitis: -Patient met sepsis criteria on admission with fever of 101 and leukocytosis of 13.4.  Lactic acid: WNL, COVID-19 negative, -Reviewed CT head, CT soft tissue and chest x-ray. -Continue IV antibiotics cefepime and vancomycin. -Blood culture is NTD.    A1c: 5.6. procalcitonin trended down from 0.57-0.43 -Continue as needed pain medications -Remained afebrile overnight.  Leukocytosis resolved.  Started on clear liquid diet  Hypertension:blood pressure is stable to lower side. -Continue to hold antihypertensive medication-amlodipine, Coreg, HCTZ, losartan at this time.    Monitor blood pressure closely -Resume home blood pressure medicines once blood pressure is back to baseline.  Depression with anxiety: Continue Celexa  Chronic back pain: Continue Robaxin and Lyrica  GERD: Continue PPI  Hyponatremia:  Resolved  Tobacco abuse: -Smokes 1 pack of cigarettes per day.  Counseled about cessation.  Refused nicotine patch.  Morbid obesity with BMI of 50: -Diet modification/exercise and weight loss recommended   DVT  prophylaxis: Lovenox Code Status: Full code Family Communication:  None present at bedside.  Plan of care discussed with patient in length and he verbalized understanding and agreed with it. Disposition Plan: Likely home in 1 to 2 days  Consultants:   None  Procedures:   None  Antimicrobials:   Cefepime  Vancomycin  Status is: Inpatient  Dispo: The patient is from: Home              Anticipated d/c is to: Home              Patient currently is not medically stable to d/c.   Difficult to place patient No         Subjective: Patient seen and examined.  Resting comfortably on the bed.  Reports improvement in left facial swelling and pain.  Remained afebrile.  Tells me that he is able to open his mouth now and feels much better.  Objective: Vitals:   11/14/20 0039 11/14/20 0300 11/14/20 0458 11/14/20 0750  BP: 127/74 128/62 128/66 117/79  Pulse: 79 70 69 70  Resp: $Remo'16 17 16 17  'HlSAw$ Temp: 98.5 F (36.9 C) 98.8 F (37.1 C) 98 F (36.7 C) 98.7 F (37.1 C)  TempSrc: Axillary Axillary Axillary Oral  SpO2: 95% 97% 97% 96%  Weight:      Height:        Intake/Output Summary (Last 24 hours) at 11/14/2020 0903 Last data filed at 11/14/2020 0458 Gross per 24 hour  Intake 361.97 ml  Output 1100 ml  Net -738.03 ml   Filed Weights   11/12/20 1816  Weight: (!) 182.8 kg    Examination:  General exam: Appears calm and comfortable, on room air, communicating  well, morbidly obese HEENT: Left-sided facial swelling noted, tender on palpation, nonerythematous, able to open his mouth as compared to yesterday HeRespiratory system: Clear to auscultation. Respiratory effort normal. Cardiovascular system: S1 & S2 heard, RRR. No JVD, murmurs, rubs, gallops or clicks. No pedal edema. Gastrointestinal system: Abdomen is nondistended, soft and nontender. No organomegaly or masses felt. Normal bowel sounds heard. Central nervous system: Alert and oriented. No focal neurological  deficits. Extremities: Symmetric 5 x 5 power. Skin: No rashes, lesions or ulcers Psychiatry: Judgement and insight appear normal. Mood & affect appropriate.    Data Reviewed: I have personally reviewed following labs and imaging studies  CBC: Recent Labs  Lab 11/12/20 1908 11/13/20 0944 11/14/20 0056  WBC 13.4* 7.1 6.5  NEUTROABS 11.0*  --   --   HGB 14.5 13.5 13.6  HCT 42.8 40.3 41.1  MCV 92.0 92.6 93.2  PLT 149* 126* 132*   Basic Metabolic Panel: Recent Labs  Lab 11/12/20 1908 11/13/20 0944  NA 132* 135  K 3.8 3.8  CL 98 102  CO2 24 26  GLUCOSE 133* 129*  BUN 15 12  CREATININE 1.17 1.10  CALCIUM 8.2* 8.2*  MG  --  2.0   GFR: Estimated Creatinine Clearance: 153.2 mL/min (by C-G formula based on SCr of 1.1 mg/dL). Liver Function Tests: Recent Labs  Lab 11/12/20 1908 11/13/20 0944  AST 19 20  ALT 32 28  ALKPHOS 71 62  BILITOT 1.4* 1.1  PROT 7.1 6.4*  ALBUMIN 3.6 3.2*   No results for input(s): LIPASE, AMYLASE in the last 168 hours. No results for input(s): AMMONIA in the last 168 hours. Coagulation Profile: Recent Labs  Lab 11/12/20 1908 11/14/20 0056  INR 1.2 1.2   Cardiac Enzymes: No results for input(s): CKTOTAL, CKMB, CKMBINDEX, TROPONINI in the last 168 hours. BNP (last 3 results) No results for input(s): PROBNP in the last 8760 hours. HbA1C: Recent Labs    11/13/20 0944  HGBA1C 5.6   CBG: No results for input(s): GLUCAP in the last 168 hours. Lipid Profile: No results for input(s): CHOL, HDL, LDLCALC, TRIG, CHOLHDL, LDLDIRECT in the last 72 hours. Thyroid Function Tests: No results for input(s): TSH, T4TOTAL, FREET4, T3FREE, THYROIDAB in the last 72 hours. Anemia Panel: No results for input(s): VITAMINB12, FOLATE, FERRITIN, TIBC, IRON, RETICCTPCT in the last 72 hours. Sepsis Labs: Recent Labs  Lab 11/12/20 1908 11/13/20 0557 11/13/20 0944 11/14/20 0056  PROCALCITON  --   --  0.57 0.43  LATICACIDVEN 0.9 0.8  --   --      Recent Results (from the past 240 hour(s))  Blood Culture (routine x 2)     Status: None (Preliminary result)   Collection Time: 11/12/20  6:53 PM   Specimen: BLOOD RIGHT ARM  Result Value Ref Range Status   Specimen Description   Final    BLOOD RIGHT ARM Performed at [facility], [address] [place], [place] [address]    Special Requests   Final    BOTTLES DRAWN AEROBIC AND ANAEROBIC Blood Culture adequate volume Performed at [facility], [address] [place], [place] [address]    Culture   Final    NO GROWTH < 24 HOURS Performed at [facility], [address], [place], [place] [address]    Report Status PENDING  Incomplete  Resp Panel by RT-PCR (Flu A&B, Covid) Nasopharyngeal Swab     Status: None   Collection Time: 11/12/20  7:08 PM   Specimen: Nasopharyngeal Swab; Nasopharyngeal(NP) swabs in vial transport medium  Result Value Ref Range Status   SARS Coronavirus 2 by RT PCR NEGATIVE NEGATIVE Final    Comment: (NOTE) SARS-CoV-2 target nucleic acids are NOT DETECTED.  The SARS-CoV-2 RNA is generally detectable in upper respiratory specimens during the acute phase of infection. The lowest concentration of SARS-CoV-2 viral copies this assay can detect is 138 copies/mL. A negative result does not preclude SARS-Cov-2 infection and should not be used as the sole basis for treatment or other patient management decisions. A negative result may occur with  improper specimen collection/handling, submission of specimen other than nasopharyngeal swab, presence of viral mutation(s) within the areas targeted by this assay, and inadequate number of viral copies(<138 copies/mL). A negative result must be combined with clinical observations, patient history, and epidemiological information. The expected result is Negative.  Fact Sheet for Patients:  EntrepreneurPulse.com.au  Fact Sheet for Healthcare Providers:   IncredibleEmployment.be  This test is no t yet approved or cleared by the [place] FDA and  has been authorized for detection and/or diagnosis of SARS-CoV-2 by FDA under an Emergency Use Authorization (EUA). This EUA will remain  in effect (meaning this test can be used) for the duration of the COVID-19 declaration under Section 564(b)(1) of the Act, 21 U.S.C.section 360bbb-3(b)(1), unless the authorization is terminated  or revoked sooner.       Influenza A by PCR NEGATIVE NEGATIVE Final   Influenza B by PCR NEGATIVE NEGATIVE Final    Comment: (NOTE) The Xpert Xpress SARS-CoV-2/FLU/RSV plus assay is intended as an aid in the diagnosis of influenza from Nasopharyngeal swab specimens and should not be used as a sole basis for treatment. Nasal washings and aspirates are unacceptable for Xpert Xpress SARS-CoV-2/FLU/RSV testing.  Fact Sheet for Patients: EntrepreneurPulse.com.au  Fact Sheet for Healthcare Providers: IncredibleEmployment.be  This test is not yet approved or cleared by the [place] FDA and has been authorized for detection and/or diagnosis of SARS-CoV-2 by FDA under an Emergency Use Authorization (EUA). This EUA will remain in effect (meaning this test can be used) for the duration of the COVID-19 declaration under Section 564(b)(1) of the Act, 21 U.S.C. section 360bbb-3(b)(1), unless the authorization is terminated or revoked.  Performed at [facility], [address] [place], [place] [address]   Urine culture     Status: None   Collection Time: 11/12/20  7:08 PM   Specimen: In/Out Cath Urine  Result Value Ref Range Status   Specimen Description   Final    IN/OUT CATH URINE Performed at [facility], [address] [place], [place] [address]    Special Requests   Final    NONE Performed at [facility], [address] [place], [place] [address]    Culture    Final    NO GROWTH Performed at [facility], [place] [address] [place], [place] [address]    Report Status 11/14/2020 FINAL  Final  Blood Culture (routine x 2)     Status: None (Preliminary result)   Collection Time: 11/12/20  7:20 PM   Specimen: BLOOD RIGHT HAND  Result Value Ref Range Status   Specimen Description   Final    BLOOD RIGHT HAND Performed at [facility], [address] [place], [place] [address]    Special Requests   Final    BOTTLES DRAWN AEROBIC AND ANAEROBIC Blood  Culture adequate volume Performed at [facility], [address] [place], [place] [address]    Culture   Final    NO GROWTH < 24 HOURS Performed at [facility], [place] [address], [place], [place] [address]    Report Status PENDING  Incomplete      Radiology Studies: CT Head Wo Contrast  Result Date: 11/12/2020 CLINICAL DATA:  Swelling of the left side of the face EXAM: CT HEAD WITHOUT CONTRAST TECHNIQUE: Contiguous axial images were obtained from the base of the skull through the vertex without intravenous contrast. COMPARISON:  None. FINDINGS: Brain: The brain shows a normal appearance without evidence of malformation, atrophy, old or acute small or large vessel infarction, mass lesion, hemorrhage, hydrocephalus or extra-axial collection. Vascular: No hyperdense vessel. No evidence of atherosclerotic calcification. Skull: Normal.  No traumatic finding.  No focal bone lesion. Sinuses/Orbits: Sinuses are clear. Orbits appear normal. Mastoids are clear. Other: See results of neck CT. IMPRESSION: 1. Normal head CT. 2. See results of neck CT. Electronically Signed   By: Nelson Chimes M.D.   On: 11/12/2020 20:30   CT Soft Tissue Neck W Contrast  Result Date: 11/12/2020 CLINICAL DATA:  Neck abscess. Lingual induration. Anterior neck swelling. Left molar abscess. EXAM: CT NECK WITH CONTRAST TECHNIQUE: Multidetector CT imaging of the neck was performed using the standard  protocol following the bolus administration of intravenous contrast. CONTRAST:  1103mL OMNIPAQUE IOHEXOL 300 MG/ML  SOLN COMPARISON:  None. FINDINGS: Pharynx and larynx: No primary mucosal or submucosal lesion identified. Tongue appears grossly normal by CT. Salivary glands: Parotid and submandibular glands are normal. Thyroid: Normal Lymph nodes: Mild reactive prominence of the lymph nodes on the left. No suppuration. Vascular: Normal Limited intracranial: Normal Visualized orbits: Normal Mastoids and visualized paranasal sinuses: Clear Skeleton: Maxillary dentition appears normal. Mandibular dentition appears normal except for to 19 which shows a filling with some adjacent lucency. No evidence of root disease. Upper chest: Negative Other: Left facial edema consistent with facial cellulitis. Swelling in the region of the gingiva adjacent to the left mandible. As noted above, there does not appear to be advanced periodontal disease by imaging however. No sign of a drainable low-density collection. IMPRESSION: Left facial cellulitis. Swelling in the region of the gingiva adjacent to the left mandible. No sign of a drainable low-density collection. No evidence of advanced periodontal disease. Dental filling tooth 19 with some adjacent lucency. Electronically Signed   By: Nelson Chimes M.D.   On: 11/12/2020 20:30   DG Chest Port 1 View  Result Date: 11/12/2020 CLINICAL DATA:  Questionable sepsis. EXAM: PORTABLE CHEST 1 VIEW COMPARISON:  Chest x-ray dated 04/22/2020 FINDINGS: Borderline cardiomegaly, stable. Lungs appear clear. No pleural effusion or pneumothorax is seen. Osseous structures about the chest are unremarkable. IMPRESSION: No active disease. No evidence of pneumonia or pulmonary edema. Electronically Signed   By: Franki Cabot M.D.   On: 11/12/2020 20:44    Scheduled Meds: . acetaminophen  650 mg Rectal Once  . citalopram  40 mg Oral Daily  . enoxaparin (LOVENOX) injection  40 mg Subcutaneous Q24H   . methocarbamol  750 mg Oral QID  . pantoprazole  40 mg Oral Daily  . pregabalin  150 mg Oral BID   Continuous Infusions: . ceFEPime (MAXIPIME) IV 2 g (11/14/20 0302)  . vancomycin 2,000 mg (11/14/20 0827)     LOS: 1 day   Time spent: 35 minutes  Lashay Osborne Loann Quill, MD [facility]  If 7PM-7AM, please contact night-coverage [email or web address] 11/14/2020, 9:03 AM

## 2020-11-14 NOTE — Plan of Care (Signed)
  Problem: Education: Goal: Knowledge of General Education information will improve Description: Including pain rating scale, medication(s)/side effects and non-pharmacologic comfort measures Outcome: Progressing   Problem: Health Behavior/Discharge Planning: Goal: Ability to manage health-related needs will improve Outcome: Progressing   Problem: Clinical Measurements: Goal: Ability to maintain clinical measurements within normal limits will improve Outcome: Progressing   Problem: Activity: Goal: Risk for activity intolerance will decrease Outcome: Progressing   Problem: Nutrition: Goal: Adequate nutrition will be maintained Outcome: Progressing   Problem: Elimination: Goal: Will not experience complications related to urinary retention Outcome: Progressing   Problem: Pain Managment: Goal: General experience of comfort will improve Outcome: Progressing   Problem: Safety: Goal: Ability to remain free from injury will improve Outcome: Progressing   

## 2020-11-15 DIAGNOSIS — E871 Hypo-osmolality and hyponatremia: Secondary | ICD-10-CM

## 2020-11-15 LAB — CBC
HCT: 40.8 % (ref 39.0–52.0)
Hemoglobin: 13.3 g/dL (ref 13.0–17.0)
MCH: 30.7 pg (ref 26.0–34.0)
MCHC: 32.6 g/dL (ref 30.0–36.0)
MCV: 94.2 fL (ref 80.0–100.0)
Platelets: 152 10*3/uL (ref 150–400)
RBC: 4.33 MIL/uL (ref 4.22–5.81)
RDW: 12 % (ref 11.5–15.5)
WBC: 5.4 10*3/uL (ref 4.0–10.5)
nRBC: 0 % (ref 0.0–0.2)

## 2020-11-15 LAB — BASIC METABOLIC PANEL
Anion gap: 8 (ref 5–15)
BUN: 12 mg/dL (ref 6–20)
CO2: 28 mmol/L (ref 22–32)
Calcium: 8.7 mg/dL — ABNORMAL LOW (ref 8.9–10.3)
Chloride: 102 mmol/L (ref 98–111)
Creatinine, Ser: 1.22 mg/dL (ref 0.61–1.24)
GFR, Estimated: >60 mL/min (ref >60)
Glucose, Bld: 96 mg/dL (ref 70–99)
Potassium: 5 mmol/L (ref 3.5–5.1)
Sodium: 138 mmol/L (ref 135–145)

## 2020-11-15 LAB — PROCALCITONIN: Procalcitonin: 0.26 ng/mL

## 2020-11-15 MED ORDER — ENOXAPARIN SODIUM 100 MG/ML IJ SOSY
90.0000 mg | PREFILLED_SYRINGE | Freq: Every day | INTRAMUSCULAR | Status: DC
  Administered 2020-11-15 – 2020-11-16 (×2): 90 mg via SUBCUTANEOUS
  Filled 2020-11-15 (×2): qty 0.9

## 2020-11-15 MED ORDER — METRONIDAZOLE 500 MG/100ML IV SOLN: 500.0000 mg | Freq: Three times a day (TID) | INTRAVENOUS | Status: DC

## 2020-11-15 MED ORDER — SODIUM CHLORIDE 0.9 % IV SOLN
3.0000 g | Freq: Four times a day (QID) | INTRAVENOUS | Status: DC
  Administered 2020-11-15 – 2020-11-16 (×5): 3 g via INTRAVENOUS
  Filled 2020-11-15 (×8): qty 8

## 2020-11-15 NOTE — Progress Notes (Addendum)
PROGRESS NOTE  Larry Hendrix WUJ:[id number] DOB: 1976/03/14 DOA: 11/12/2020 PCP: Ofilia NeasSpivey, Steven J, MD   LOS: 2 days   Brief narrative: Larry Hendrix a 45 y.o.malewith medical history significant for history ofhypertension, depression with anxiety, chronic back pain, tobacco abuse was transferred from [facility] with left facial swelling edema fever and progressive pain.  In the ED, patient was febrile with a temperature of 101 F with leukocytosis at 13.4.  CT head was negative but soft tissue was concerning for left facial cellulitis.  Patient was given vancomycin and cefepime and metronidazole in the ED and was [facility] evaluation and treatment   Assessment/Plan:  Principal Problem:   Sepsis (HCC) Active Problems:   Facial cellulitis   Hypertension   Tobacco use disorder   Morbid obesity (HCC)   Hyponatremia   Depression with anxiety  Sepsis secondary to left facial cellulitis: Likely odontogenic in nature.  He was supposed to extract his teeth as outpatient.  He was advised to follow-up with his dentist after discharge.  Blood cultures negative so far.  On cefepime and vancomycin.  Add metronidazole for now.  Afebrile.  Continue soft diet as tolerated.  Blood cultures negative in 3 days.  Pro-Cal 0.2.  Still with significant swelling and edema.  Patient had failed outpatient amoxicillin treatment.  We will continue IV antibiotic for at least 1 more day.  Reassess in a.m.   Essential hypertension Continue Coreg, HCTZ, losartan.  Blood pressure stable at this time.  Depression with anxiety: Continue Celexa  Chronic back pain: Continue Robaxin and Lyrica  GERD:Continue PPI  Hyponatremia: Resolved, latest sodium 138  Tobacco abuse: A pack of cigarettes a day.  Refused nicotine patch  Morbid obesity with BMI of 50: Will recommend lifestyle modification    DVT prophylaxis: SCDs Start: 11/13/20 0851    Addendum:  11/15/2020 2:09 PM  Spoke with pharmacy.  We will change the antibiotic to Zosyn at this time instead of Vanco cefepime and Flagyl.  Code Status: Full code  Family Communication: None  Status is: Inpatient  Remains inpatient appropriate because:IV treatments appropriate due to intensity of illness or inability to take PO and Inpatient level of care appropriate due to severity of illness   Dispo: The patient is from: Home              Anticipated d/c is to: Home likely by tomorrow, or in 1 to 2 days if continues to improve.              Patient currently is not medically stable to d/c.   Difficult to place patient No   Consultants:  None  Procedures:  None  Anti-infectives:  Marland Kitchen. Vancomycin and cefepime  Anti-infectives (From admission, onward)   Start     Dose/Rate Route Frequency Ordered Stop   11/13/20 2030  vancomycin (VANCOREADY) IVPB 2000 mg/400 mL        2,000 mg 200 mL/hr over 120 Minutes Intravenous Every 12 hours 11/13/20 0853     11/13/20 0300  ceFEPIme (MAXIPIME) 2 g in sodium chloride 0.9 % 100 mL IVPB        2 g 200 mL/hr over 30 Minutes Intravenous Every 8 hours 11/12/20 1947     11/12/20 2000  vancomycin (VANCOCIN) IVPB 1000 mg/200 mL premix       "And" Linked Group Details   1,000 mg 200 mL/hr over 60 Minutes Intravenous Every 12 hours 11/12/20 1948 11/13/20 0931  11/12/20 2000  vancomycin (VANCOCIN) IVPB 1000 mg/200 mL premix       "And" Linked Group Details   1,000 mg 200 mL/hr over 60 Minutes Intravenous Every 12 hours 11/12/20 1948 11/13/20 1059   11/12/20 1930  vancomycin (VANCOCIN) IVPB 1000 mg/200 mL premix  Status:  Discontinued       "And" Linked Group Details   1,000 mg 200 mL/hr over 60 Minutes Intravenous  Once 11/12/20 1915 11/12/20 1946   11/12/20 1930  vancomycin (VANCOCIN) IVPB 1000 mg/200 mL premix  Status:  Discontinued       "And" Linked Group Details   1,000 mg 200 mL/hr over 60 Minutes Intravenous  Once 11/12/20 1915  11/12/20 1946   11/12/20 1915  ceFEPIme (MAXIPIME) 2 g in sodium chloride 0.9 % 100 mL IVPB        2 g 200 mL/hr over 30 Minutes Intravenous  Once 11/12/20 1904 11/12/20 2012   11/12/20 1915  metroNIDAZOLE (FLAGYL) IVPB 500 mg        500 mg 100 mL/hr over 60 Minutes Intravenous  Once 11/12/20 1904 11/12/20 2157   11/12/20 1915  vancomycin (VANCOCIN) IVPB 1000 mg/200 mL premix  Status:  Discontinued        1,000 mg 200 mL/hr over 60 Minutes Intravenous  Once 11/12/20 1904 11/12/20 1915       Subjective: Today, patient was seen and examined at bedside.  Complains of mild left cheek swelling.  No fever or chills.  Still has difficulty with chewing and significant swelling.  Objective: Vitals:   11/15/20 0241 11/15/20 0840  BP: (!) 135/92 138/89  Pulse: 64 (!) 58  Resp: 19 20  Temp: 98.4 F (36.9 C) 98.2 F (36.8 C)  SpO2: 97% 96%    Intake/Output Summary (Last 24 hours) at 11/15/2020 1011 Last data filed at 11/15/2020 0750 Gross per 24 hour  Intake 640 ml  Output 2100 ml  Net -1460 ml   Filed Weights   11/12/20 1816  Weight: (!) 182.8 kg   Body mass index is 50.37 kg/m.   Physical Exam: GENERAL: Patient is alert awake and oriented. Not in obvious distress.  Morbidly obese HENT: No scleral pallor or icterus. Pupils equally reactive to light. Oral mucosa is moist.  Left facial swelling with tenderness and induration, mild trismus noted.  Left submandibular induration and swelling as well. NECK: is supple, CHEST: Clear to auscultation. No crackles or wheezes.  Diminished breath sounds bilaterally. CVS: S1 and S2 heard, no murmur. Regular rate and rhythm.  ABDOMEN: Soft, non-tender, bowel sounds are present. EXTREMITIES: No edema. CNS: Cranial nerves are intact. No focal motor deficits. SKIN: warm and dry without rashes.  Data Review: I have personally reviewed the following laboratory data and studies,  CBC: Recent Labs  Lab 11/12/20 1908 11/13/20 0944 11/14/20 0056  11/15/20 0356  WBC 13.4* 7.1 6.5 5.4  NEUTROABS 11.0*  --   --   --   HGB 14.5 13.5 13.6 13.3  HCT 42.8 40.3 41.1 40.8  MCV 92.0 92.6 93.2 94.2  PLT 149* 126* 132* 152   Basic Metabolic Panel: Recent Labs  Lab 11/12/20 1908 11/13/20 0944 11/15/20 0356  NA 132* 135 138  K 3.8 3.8 5.0  CL 98 102 102  CO2 24 26 28   GLUCOSE 133* 129* 96  BUN 15 12 12   CREATININE 1.17 1.10 1.22  CALCIUM 8.2* 8.2* 8.7*  MG  --  2.0  --    Liver Function Tests: Recent  Labs  Lab 11/12/20 1908 11/13/20 0944  AST 19 20  ALT 32 28  ALKPHOS 71 62  BILITOT 1.4* 1.1  PROT 7.1 6.4*  ALBUMIN 3.6 3.2*   No results for input(s): LIPASE, AMYLASE in the last 168 hours. No results for input(s): AMMONIA in the last 168 hours. Cardiac Enzymes: No results for input(s): CKTOTAL, CKMB, CKMBINDEX, TROPONINI in the last 168 hours. BNP (last 3 results) No results for input(s): BNP in the last 8760 hours.  ProBNP (last 3 results) No results for input(s): PROBNP in the last 8760 hours.  CBG: No results for input(s): GLUCAP in the last 168 hours. Recent Results (from the past 240 hour(s))  Blood Culture (routine x 2)     Status: None (Preliminary result)   Collection Time: 11/12/20  6:53 PM   Specimen: BLOOD RIGHT ARM  Result Value Ref Range Status   Specimen Description   Final    BLOOD RIGHT ARM Performed at [facility], [address] [place], [place] [address]    Special Requests   Final    BOTTLES DRAWN AEROBIC AND ANAEROBIC Blood Culture adequate volume Performed at [facility], [address] [place], [place] [address]    Culture   Final    NO GROWTH 3 DAYS Performed at [facility], [address] [place], [place] [address]    Report Status PENDING  Incomplete  Resp Panel by RT-PCR (Flu A&B, Covid) Nasopharyngeal Swab     Status: None   Collection Time: 11/12/20  7:08 PM   Specimen: Nasopharyngeal Swab; Nasopharyngeal(NP) swabs in vial transport medium   Result Value Ref Range Status   SARS Coronavirus 2 by RT PCR NEGATIVE NEGATIVE Final    Comment: (NOTE) SARS-CoV-2 target nucleic acids are NOT DETECTED.  The SARS-CoV-2 RNA is generally detectable in upper respiratory specimens during the acute phase of infection. The lowest concentration of SARS-CoV-2 viral copies this assay can detect is 138 copies/mL. A negative result does not preclude SARS-Cov-2 infection and should not be used as the sole basis for treatment or other patient management decisions. A negative result may occur with  improper specimen collection/handling, submission of specimen other than nasopharyngeal swab, presence of viral mutation(s) within the areas targeted by this assay, and inadequate number of viral copies(<138 copies/mL). A negative result must be combined with clinical observations, patient history, and epidemiological information. The expected result is Negative.  Fact Sheet for Patients:  BloggerCourse.com  Fact Sheet for Healthcare Providers:  SeriousBroker.it  This test is no t yet approved or cleared by the [place] FDA and  has been authorized for detection and/or diagnosis of SARS-CoV-2 by FDA under an Emergency Use Authorization (EUA). This EUA will remain  in effect (meaning this test can be used) for the duration of the COVID-19 declaration under Section 564(b)(1) of the Act, 21 U.S.C.section 360bbb-3(b)(1), unless the authorization is terminated  or revoked sooner.       Influenza A by PCR NEGATIVE NEGATIVE Final   Influenza B by PCR NEGATIVE NEGATIVE Final    Comment: (NOTE) The Xpert Xpress SARS-CoV-2/FLU/RSV plus assay is intended as an aid in the diagnosis of influenza from Nasopharyngeal swab specimens and should not be used as a sole basis for treatment. Nasal washings and aspirates are unacceptable for Xpert Xpress SARS-CoV-2/FLU/RSV testing.  Fact Sheet for  Patients: BloggerCourse.com  Fact Sheet for Healthcare Providers: SeriousBroker.it  This test is not yet approved or cleared  by the [place] and has been authorized for detection and/or diagnosis of SARS-CoV-2 by FDA under an Emergency Use Authorization (EUA). This EUA will remain in effect (meaning this test can be used) for the duration of the COVID-19 declaration under Section 564(b)(1) of the Act, 21 U.S.C. section 360bbb-3(b)(1), unless the authorization is terminated or revoked.  Performed at [facility], [address] [place], [place] [address]   Urine culture     Status: None   Collection Time: 11/12/20  7:08 PM   Specimen: In/Out Cath Urine  Result Value Ref Range Status   Specimen Description   Final    IN/OUT CATH URINE Performed at [facility], [address] [place], [place] [address]    Special Requests   Final    NONE Performed at [facility], [address] [place], [place] [address]    Culture   Final    NO GROWTH Performed at [facility], [address] [place], [place] [address]    Report Status 11/14/2020 FINAL  Final  Blood Culture (routine x 2)     Status: None (Preliminary result)   Collection Time: 11/12/20  7:20 PM   Specimen: BLOOD RIGHT HAND  Result Value Ref Range Status   Specimen Description   Final    BLOOD RIGHT HAND Performed at [facility], [address] [place], [place] [address]    Special Requests   Final    BOTTLES DRAWN AEROBIC AND ANAEROBIC Blood Culture adequate volume Performed at [facility], [address] [place], [place] [address]    Culture   Final    NO GROWTH 3 DAYS Performed at [facility], [address], [place], [place] [address]    Report Status PENDING  Incomplete     Studies: No results found.    Joycelyn Das, MD  [facility] 11/15/2020  If 7PM-7AM, please  contact night-coverage

## 2020-11-15 NOTE — Plan of Care (Signed)
  Problem: Education: Goal: Knowledge of General Education information will improve Description: Including pain rating scale, medication(s)/side effects and non-pharmacologic comfort measures Outcome: Progressing   Problem: Activity: Goal: Risk for activity intolerance will decrease Outcome: Progressing   Problem: Nutrition: Goal: Adequate nutrition will be maintained Outcome: Progressing   

## 2020-11-15 NOTE — Plan of Care (Signed)
Patient is alert and oriented x4. Sits on the side of the bed independently to use the urinal. Oxycodone and tylenol administered for pain. Facial swelling ongoing, but patient reports marked improvement, just that the pain has resumed. Ice pack provided. Remains on IV antibiotics. Problem: Education: Goal: Knowledge of General Education information will improve Description: Including pain rating scale, medication(s)/side effects and non-pharmacologic comfort measures Outcome: Progressing   Problem: Health Behavior/Discharge Planning: Goal: Ability to manage health-related needs will improve Outcome: Progressing   Problem: Clinical Measurements: Goal: Ability to maintain clinical measurements within normal limits will improve Outcome: Progressing Goal: Will remain free from infection Outcome: Progressing Goal: Diagnostic test results will improve Outcome: Progressing Goal: Respiratory complications will improve Outcome: Progressing Goal: Cardiovascular complication will be avoided Outcome: Progressing   Problem: Activity: Goal: Risk for activity intolerance will decrease Outcome: Progressing   Problem: Nutrition: Goal: Adequate nutrition will be maintained Outcome: Progressing   Problem: Coping: Goal: Level of anxiety will decrease Outcome: Progressing   Problem: Elimination: Goal: Will not experience complications related to bowel motility Outcome: Progressing Goal: Will not experience complications related to urinary retention Outcome: Progressing   Problem: Pain Managment: Goal: General experience of comfort will improve Outcome: Progressing   Problem: Safety: Goal: Ability to remain free from injury will improve Outcome: Progressing   Problem: Skin Integrity: Goal: Risk for impaired skin integrity will decrease Outcome: Progressing

## 2020-11-15 NOTE — Plan of Care (Signed)

## 2020-11-15 NOTE — Progress Notes (Signed)
Pharmacy Antibiotic Note  Larry Hendrix is a 45 y.o. male admitted on 11/12/2020 with facial swelling and pain.  Pharmacy has been consulted for vancomycin and cefepime dosing for cellulitis.  Renal function stable, afebrile, WBC normalized, PCT 0.26, LA 0.8.  Plan: Vanc 2gm IV Q12H for AUC 529 using SCr 1.22 Cefepime 2gm IV Q8H Monitor renal fxn, clinical progress, vanc levels at Css vs de-escalating abx  Increase Lovenox to 90mg /d for BMI 50  Height: 6\' 3"  (190.5 cm) Weight: (!) 182.8 kg (403 lb) IBW/kg (Calculated) : 84.5  Temp (24hrs), Avg:98.5 F (36.9 C), Min:98.2 F (36.8 C), Max:98.8 F (37.1 C)  Recent Labs  Lab 11/12/20 1908 11/13/20 0557 11/13/20 0944 11/14/20 0056 11/15/20 0356  WBC 13.4*  --  7.1 6.5 5.4  CREATININE 1.17  --  1.10  --  1.22  LATICACIDVEN 0.9 0.8  --   --   --     Estimated Creatinine Clearance: 138.1 mL/min (by C-G formula based on SCr of 1.22 mg/dL).    No Known Allergies  Flagyl x1 4/29 Vanc 4/29 >> Cefepime 4/29 >>  4/29 BCx - NGTD 4/29 UCx - negative  Pluma Diniz D. 5/29, PharmD, BCPS, BCCCP 11/15/2020, 8:44 AM

## 2020-11-15 NOTE — Progress Notes (Signed)
Pharmacy Antibiotic Note  Larry Hendrix is a 45 y.o. male admitted on 11/12/2020 with facial cellulitis. Imaging without concern for fluid collections.  Pharmacy has been consulted for Unasyn dosing.  Discontinue vancomycin, cefepime and metronidazole. Start Unasyn 3g q6hr. Consider PO switch when appropriate. WBC wnl, afeb.   Plan: Stop vancomycin Stop cefepime Stop metronidazole Start Unasyn 3g q6hr  Height: 6\' 3"  (190.5 cm) Weight: (!) 182.8 kg (403 lb) IBW/kg (Calculated) : 84.5  Temp (24hrs), Avg:98.4 F (36.9 C), Min:98.2 F (36.8 C), Max:98.7 F (37.1 C)  Recent Labs  Lab 11/12/20 1908 11/13/20 0557 11/13/20 0944 11/14/20 0056 11/15/20 0356  WBC 13.4*  --  7.1 6.5 5.4  CREATININE 1.17  --  1.10  --  1.22  LATICACIDVEN 0.9 0.8  --   --   --     Estimated Creatinine Clearance: 138.1 mL/min (by C-G formula based on SCr of 1.22 mg/dL).    No Known Allergies Microbiology results: 4/29 BCx: ngtd x3d  Thank you for allowing pharmacy to be a part of this patient's care.  5/29, PharmD PGY2 ID Pharmacy Resident Phone between 7 am - 3:30 pm: Margarite Gouge  Please check AMION for all [facility] Pharmacy phone numbers After 10:00 PM, call Main Pharmacy [phone]  11/15/2020 12:46 PM

## 2020-11-16 LAB — BASIC METABOLIC PANEL
Anion gap: 7 (ref 5–15)
BUN: 10 mg/dL (ref 6–20)
CO2: 28 mmol/L (ref 22–32)
Calcium: 8.8 mg/dL — ABNORMAL LOW (ref 8.9–10.3)
Chloride: 103 mmol/L (ref 98–111)
Creatinine, Ser: 1.05 mg/dL (ref 0.61–1.24)
GFR, Estimated: >60 mL/min (ref >60)
Glucose, Bld: 94 mg/dL (ref 70–99)
Potassium: 4.6 mmol/L (ref 3.5–5.1)
Sodium: 138 mmol/L (ref 135–145)

## 2020-11-16 LAB — CBC
HCT: 40.1 % (ref 39.0–52.0)
Hemoglobin: 13.7 g/dL (ref 13.0–17.0)
MCH: 31.4 pg (ref 26.0–34.0)
MCHC: 34.2 g/dL (ref 30.0–36.0)
MCV: 92 fL (ref 80.0–100.0)
Platelets: 151 10*3/uL (ref 150–400)
RBC: 4.36 MIL/uL (ref 4.22–5.81)
RDW: 11.9 % (ref 11.5–15.5)
WBC: 5 10*3/uL (ref 4.0–10.5)
nRBC: 0 % (ref 0.0–0.2)

## 2020-11-16 LAB — PHOSPHORUS: Phosphorus: 3.9 mg/dL (ref 2.5–4.6)

## 2020-11-16 LAB — MAGNESIUM: Magnesium: 2.1 mg/dL (ref 1.7–2.4)

## 2020-11-16 MED ORDER — AMOXICILLIN-POT CLAVULANATE 875-125 MG PO TABS: 1.0000 | ORAL_TABLET | Freq: Two times a day (BID) | ORAL | 0 refills | Status: AC

## 2020-11-16 MED ORDER — IBUPROFEN 800 MG PO TABS: 800.0000 mg | ORAL_TABLET | Freq: Three times a day (TID) | ORAL | 0 refills | Status: AC | PRN

## 2020-11-16 NOTE — Plan of Care (Signed)

## 2020-11-16 NOTE — Discharge Summary (Signed)
Physician Discharge Summary  Larry Hendrix VQM:[id number] DOB: 09-May-1976 DOA: 11/12/2020  PCP: Ofilia Neas, MD  Admit date: 11/12/2020 Discharge date: 11/16/2020  Time spent: 30 minutes  Recommendations for Outpatient Follow-up:  1. Rested blood pressure and follow electrolytes/renal function 2. Repeat CBC to follow hemoglobin stability 3. Reassess complete resolution of patient lower extremity cellulitis.   Discharge Diagnoses:  Principal Problem:   Sepsis (HCC) Active Problems:   Facial cellulitis   Hypertension   Tobacco use disorder   Morbid obesity (HCC)   Hyponatremia   Depression with anxiety   Discharge Condition: Stable and improved.  Discharged home with instructions follow-up with PCP in 10 days.  Diet recommendation: Heart healthy/low calorie diet.  Filed Weights   11/12/20 1816  Weight: (!) 182.8 kg    History of present illness:  Larry Blankenshipis a 45 y.o.malewith medical history significant for history ofhypertension, depression with anxiety, chronic back pain, tobacco abuse was transferred from [facility] with left facial swelling edema fever and progressive pain.  In the ED, patient was febrile with a temperature of 101 F with leukocytosis at 13.4.  CT head was negative but soft tissue was concerning for left facial cellulitis.  Patient was given vancomycin and cefepime and metronidazole in the ED and was transferred to [facility] for further evaluation and treatment  Hospital Course:  Sepsis secondary to left facial cellulitis: Likely odontogenic in nature.  He was supposed to extract his teeth as outpatient.  He was advised to follow-up with his dentist after discharge.  Blood cultures negative so far.  On cefepime and vancomycin while inpatient.    Patient has been discharged on oral Augmentin lower strength 1 tablet twice a day for 14 days.  Add metronidazole for now.  Afebrile.  Continue soft diet as tolerated.  Blood  cultures negative in 3 days.  Pro-Cal 0.2.    Essential hypertension -Continue Coreg, HCTZ, losartan.   -Blood pressure stable at this time. -Advised to follow heart healthy diet.  Depression with anxiety:  -Stable mood. -Continue Celexa  Chronic back pain: Continue Robaxin and Lyrica  GERD: Continue PPI  Hyponatremia:  -Resolved, latest sodium 138 -Advised to maintain adequate nutrition and hydration.  Tobacco abuse: -A pack of cigarettes a day.   -Refused nicotine patch. -Cessation counseling provided.  Morbid obesity with BMI of 50: Body mass index is 50.37 kg/m. -Lifestyle changes recommended; patient instructed to follow a low calorie diet, -portion control and increase physical activity. -Will benefit of outpatient referral to bariatric clinic.  Procedures:  Lifestyle changes has been recommended  -Patient advised to follow low calorie diet, portion control and increase physical activity  Consultations:  None  Discharge Exam: Vitals:   11/15/20 1952 11/16/20 0800  BP: 124/75 (!) 140/94  Pulse: 60 (!) 55  Resp: 19 16  Temp: 98.3 F (36.8 C) (!) 97.4 F (36.3 C)  SpO2: 99% 97%    General: Morbidly obese, no chest pain, no nausea, no vomiting.  Reports he is tolerating soft diet and feeling ready to go home.  Sepsis features resolved.  Positive left facial mandible swelling and mild tenderness on palpation. Cardiovascular: S1 and S2, no rubs, no gallops, no JVD.   Respiratory: Good air movement bilaterally; no wheezing, no crackles, no using accessory muscles.  Good saturation on room air. Abdomen: Obese, positive bowel sounds, no tenderness to palpation.  No guarding. Extremities: No cyanosis or clubbing.  Discharge Instructions   Discharge Instructions  Diet - low sodium heart healthy   Complete by: As directed    Discharge instructions   Complete by: As directed    Take medications as prescribed Follow-up with your dentist as  discussed for tooth extraction. Maintain adequate hydration and follow soft diet Follow low calorie diet   Increase activity slowly   Complete by: As directed      Allergies as of 11/16/2020   No Known Allergies     Medication List    STOP taking these medications   amoxicillin 500 MG capsule Commonly known as: AMOXIL   meloxicam 15 MG tablet Commonly known as: MOBIC     TAKE these medications   albuterol 108 (90 Base) MCG/ACT inhaler Commonly known as: VENTOLIN HFA Inhale 1-2 puffs into the lungs every 6 (six) hours as needed for shortness of breath.   amLODipine 10 MG tablet Commonly known as: NORVASC Take 1 tablet by mouth daily.   amoxicillin-clavulanate 875-125 MG tablet Commonly known as: Augmentin Take 1 tablet by mouth 2 (two) times daily for 8 days.   carvedilol 25 MG tablet Commonly known as: COREG Take 25 mg by mouth 2 (two) times daily.   citalopram 40 MG tablet Commonly known as: CELEXA Take 40 mg by mouth daily.   hydrochlorothiazide 12.5 MG tablet Commonly known as: HYDRODIURIL Take 12.5 mg by mouth daily.   ibuprofen 800 MG tablet Commonly known as: ADVIL Take 1 tablet (800 mg total) by mouth every 8 (eight) hours as needed for fever, headache or moderate pain.   losartan 25 MG tablet Commonly known as: COZAAR Take 25 mg by mouth daily.   methocarbamol 750 MG tablet Commonly known as: ROBAXIN Take 750 mg by mouth 4 (four) times daily.   omeprazole 40 MG capsule Commonly known as: PRILOSEC Take 1 capsule by mouth daily.   pregabalin 150 MG capsule Commonly known as: LYRICA Take 150 mg by mouth 2 (two) times daily.      No Known Allergies  Follow-up Information    Ofilia Neas, MD. Schedule an appointment as soon as possible for a visit in 10 day(s).   Specialty: Family Medicine Contact information: [address] [place] [address] [phone]                The results of significant diagnostics from  this hospitalization (including imaging, microbiology, ancillary and laboratory) are listed below for reference.    Significant Diagnostic Studies: CT Head Wo Contrast  Result Date: 11/12/2020 CLINICAL DATA:  Swelling of the left side of the face EXAM: CT HEAD WITHOUT CONTRAST TECHNIQUE: Contiguous axial images were obtained from the base of the skull through the vertex without intravenous contrast. COMPARISON:  None. FINDINGS: Brain: The brain shows a normal appearance without evidence of malformation, atrophy, old or acute small or large vessel infarction, mass lesion, hemorrhage, hydrocephalus or extra-axial collection. Vascular: No hyperdense vessel. No evidence of atherosclerotic calcification. Skull: Normal.  No traumatic finding.  No focal bone lesion. Sinuses/Orbits: Sinuses are clear. Orbits appear normal. Mastoids are clear. Other: See results of neck CT. IMPRESSION: 1. Normal head CT. 2. See results of neck CT. Electronically Signed   By: Paulina Fusi M.D.   On: 11/12/2020 20:30   CT Soft Tissue Neck W Contrast  Result Date: 11/12/2020 CLINICAL DATA:  Neck abscess. Lingual induration. Anterior neck swelling. Left molar abscess. EXAM: CT NECK WITH CONTRAST TECHNIQUE: Multidetector CT imaging of the neck was performed using the standard protocol following  the bolus administration of intravenous contrast. CONTRAST:  OMNIPAQUE IOHEXOL 300 MG/ML  SOLN COMPARISON:  None. FINDINGS: Pharynx and larynx: No primary mucosal or submucosal lesion identified. Tongue appears grossly normal by CT. Salivary glands: Parotid and submandibular glands are normal. Thyroid: Normal Lymph nodes: Mild reactive prominence of the lymph nodes on the left. No suppuration. Vascular: Normal Limited intracranial: Normal Visualized orbits: Normal Mastoids and visualized paranasal sinuses: Clear Skeleton: Maxillary dentition appears normal. Mandibular dentition appears normal except for to 19 which shows a filling with some  adjacent lucency. No evidence of root disease. Upper chest: Negative Other: Left facial edema consistent with facial cellulitis. Swelling in the region of the gingiva adjacent to the left mandible. As noted above, there does not appear to be advanced periodontal disease by imaging however. No sign of a drainable low-density collection. IMPRESSION: Left facial cellulitis. Swelling in the region of the gingiva adjacent to the left mandible. No sign of a drainable low-density collection. No evidence of advanced periodontal disease. Dental filling tooth 19 with some adjacent lucency. Electronically Signed   By: Paulina Fusi M.D.   On: 11/12/2020 20:30   DG Chest Port 1 View  Result Date: 11/12/2020 CLINICAL DATA:  Questionable sepsis. EXAM: PORTABLE CHEST 1 VIEW COMPARISON:  Chest x-ray dated 04/22/2020 FINDINGS: Borderline cardiomegaly, stable. Lungs appear clear. No pleural effusion or pneumothorax is seen. Osseous structures about the chest are unremarkable. IMPRESSION: No active disease. No evidence of pneumonia or pulmonary edema. Electronically Signed   By: Bary Richard M.D.   On: 11/12/2020 20:44    Microbiology: Recent Results (from the past 240 hour(s))  Blood Culture (routine x 2)     Status: None (Preliminary result)   Collection Time: 11/12/20  6:53 PM   Specimen: BLOOD RIGHT ARM  Result Value Ref Range Status   Specimen Description   Final    BLOOD RIGHT ARM Performed at [facility], [address] [place], [place] [address]    Special Requests   Final    BOTTLES DRAWN AEROBIC AND ANAEROBIC Blood Culture adequate volume Performed at [facility], [address] [place], [place] [address]    Culture   Final    NO GROWTH 4 DAYS Performed at [facility], [address], [place], [place] [address]    Report Status PENDING  Incomplete  Resp Panel by RT-PCR (Flu A&B, Covid) Nasopharyngeal Swab     Status: None   Collection Time: 11/12/20  7:08 PM    Specimen: Nasopharyngeal Swab; Nasopharyngeal(NP) swabs in vial transport medium  Result Value Ref Range Status   SARS Coronavirus 2 by RT PCR NEGATIVE NEGATIVE Final    Comment: (NOTE) SARS-CoV-2 target nucleic acids are NOT DETECTED.  The SARS-CoV-2 RNA is generally detectable in upper respiratory specimens during the acute phase of infection. The lowest concentration of SARS-CoV-2 viral copies this assay can detect is 138 copies/mL. A negative result does not preclude SARS-Cov-2 infection and should not be used as the sole basis for treatment or other patient management decisions. A negative result may occur with  improper specimen collection/handling, submission of specimen other than nasopharyngeal swab, presence of viral mutation(s) within the areas targeted by this assay, and inadequate number of viral copies(<138 copies/mL). A negative result must be combined with clinical observations, patient history, and epidemiological information. The expected result is Negative.  Fact Sheet for Patients:  BloggerCourse.com  Fact Sheet for Healthcare Providers:  SeriousBroker.it  This test is no t yet approved or cleared by the [place] and  has been authorized for detection and/or diagnosis of SARS-CoV-2 by FDA under an Emergency Use Authorization (EUA). This EUA will remain  in effect (meaning this test can be used) for the duration of the COVID-19 declaration under Section 564(b)(1) of the Act, 21 U.S.C.section 360bbb-3(b)(1), unless the authorization is terminated  or revoked sooner.       Influenza A by PCR NEGATIVE NEGATIVE Final   Influenza B by PCR NEGATIVE NEGATIVE Final    Comment: (NOTE) The Xpert Xpress SARS-CoV-2/FLU/RSV plus assay is intended as an aid in the diagnosis of influenza from Nasopharyngeal swab specimens and should not be used as a sole basis for treatment. Nasal washings and aspirates are  unacceptable for Xpert Xpress SARS-CoV-2/FLU/RSV testing.  Fact Sheet for Patients: BloggerCourse.com  Fact Sheet for Healthcare Providers: SeriousBroker.it  This test is not yet approved or cleared by the [place] FDA and has been authorized for detection and/or diagnosis of SARS-CoV-2 by FDA under an Emergency Use Authorization (EUA). This EUA will remain in effect (meaning this test can be used) for the duration of the COVID-19 declaration under Section 564(b)(1) of the Act, 21 U.S.C. section 360bbb-3(b)(1), unless the authorization is terminated or revoked.  Performed at [facility], [address] [place], [place] [address]   Urine culture     Status: None   Collection Time: 11/12/20  7:08 PM   Specimen: In/Out Cath Urine  Result Value Ref Range Status   Specimen Description   Final    IN/OUT CATH URINE Performed at [facility], [address] [place], [place] [address]    Special Requests   Final    NONE Performed at [facility], [address] [place], [place] [address]    Culture   Final    NO GROWTH Performed at [facility], [address], [place], [place] [address]    Report Status 11/14/2020 FINAL  Final  Blood Culture (routine x 2)     Status: None (Preliminary result)   Collection Time: 11/12/20  7:20 PM   Specimen: BLOOD RIGHT HAND  Result Value Ref Range Status   Specimen Description   Final    BLOOD RIGHT HAND Performed at [facility], [address] [place], [place] [address]    Special Requests   Final    BOTTLES DRAWN AEROBIC AND ANAEROBIC Blood Culture adequate volume Performed at [facility], [address], [place], [place] [address]    Culture   Final    NO GROWTH 4 DAYS Performed at [facility], [address], [place], [place] [address]    Report Status PENDING  Incomplete     Labs: Basic Metabolic  Panel: Recent Labs  Lab 11/12/20 1908 11/13/20 0944 11/15/20 0356 11/16/20 0431  NA 132* 135 138 138  K 3.8 3.8 5.0 4.6  CL 98 102 102 103  CO2 24 26 28 28   GLUCOSE 133* 129* 96 94  BUN 15 12 12 10   CREATININE 1.17 1.10 1.22 1.05  CALCIUM 8.2* 8.2* 8.7* 8.8*  MG  --  2.0  --  2.1  PHOS  --   --   --  3.9   Liver Function Tests: Recent Labs  Lab 11/12/20 1908 11/13/20 0944  AST 19 20  ALT 32 28  ALKPHOS 71 62  BILITOT 1.4* 1.1  PROT 7.1 6.4*  ALBUMIN 3.6 3.2*   CBC: Recent Labs  Lab 11/12/20 1908 11/13/20 0944 11/14/20 0056 11/15/20 0356 11/16/20 0431  WBC 13.4* 7.1 6.5 5.4 5.0  NEUTROABS 11.0*  --   --   --   --   HGB 14.5 13.5 13.6 13.3 13.7  HCT 42.8 40.3 41.1 40.8 40.1  MCV 92.0 92.6 93.2 94.2 92.0  PLT 149* 126* 132* 152 151    Signed:  Vassie Lollarlos Myishia Kasik MD.  [facility] 11/16/2020, 3:04 PM

## 2020-11-16 NOTE — Progress Notes (Signed)
Provided discharge education/instructions, all questions and concerns addressed, Pt not in distress, Pt to discharge home with belongings accompanied by wife.

## 2020-11-17 LAB — CULTURE, BLOOD (ROUTINE X 2)
Culture: NO GROWTH
Culture: NO GROWTH
Special Requests: ADEQUATE
Special Requests: ADEQUATE

## 2021-03-25 ENCOUNTER — Emergency Department (HOSPITAL_BASED_OUTPATIENT_CLINIC_OR_DEPARTMENT_OTHER): Payer: 59

## 2021-03-25 ENCOUNTER — Emergency Department (HOSPITAL_BASED_OUTPATIENT_CLINIC_OR_DEPARTMENT_OTHER)
Admission: EM | Admit: 2021-03-25 | Discharge: 2021-03-25 | Disposition: A | Discharge status: Home / Self Care | Payer: 59 | Attending: Emergency Medicine | Admitting: Emergency Medicine

## 2021-03-25 ENCOUNTER — Encounter (HOSPITAL_BASED_OUTPATIENT_CLINIC_OR_DEPARTMENT_OTHER): Payer: Self-pay

## 2021-03-25 ENCOUNTER — Other Ambulatory Visit: Payer: Self-pay

## 2021-03-25 DIAGNOSIS — I10 Essential (primary) hypertension: Secondary | ICD-10-CM | POA: Diagnosis not present

## 2021-03-25 DIAGNOSIS — F17210 Nicotine dependence, cigarettes, uncomplicated: Secondary | ICD-10-CM | POA: Insufficient documentation

## 2021-03-25 DIAGNOSIS — R2231 Localized swelling, mass and lump, right upper limb: Secondary | ICD-10-CM | POA: Diagnosis present

## 2021-03-25 DIAGNOSIS — L03113 Cellulitis of right upper limb: Secondary | ICD-10-CM | POA: Insufficient documentation

## 2021-03-25 DIAGNOSIS — Z79899 Other long term (current) drug therapy: Secondary | ICD-10-CM | POA: Insufficient documentation

## 2021-03-25 HISTORY — DX: Dorsalgia, unspecified: M54.9

## 2021-03-25 MED ORDER — CEPHALEXIN 250 MG PO CAPS
500.0000 mg | ORAL_CAPSULE | Freq: Once | ORAL | Status: AC
  Administered 2021-03-25: 500 mg via ORAL
  Filled 2021-03-25: qty 2

## 2021-03-25 MED ORDER — CEPHALEXIN 500 MG PO CAPS: 500.0000 mg | ORAL_CAPSULE | Freq: Two times a day (BID) | ORAL | 0 refills | Status: AC

## 2021-03-25 NOTE — ED Triage Notes (Signed)
Pt c/o swelling to right hand day 2-states he had stitches 9 days ago-removed 2 days ago-NAD-steady gait

## 2021-03-25 NOTE — ED Provider Notes (Signed)
[facility] EMERGENCY DEPARTMENT Provider Note   CSN: [id number] Arrival date & time: 03/25/21  2012     History Chief Complaint  Patient presents with   Hand Problem    Larry Hendrix is a 45 y.o. male with a past medical history of hypertension presenting to the ED with a chief complaint of right hand swelling.  Had stitches placed in his right hand 9 days ago and removed 2 days ago.  The next morning after removal he woke up and noticed worsening pain and swelling.  He has had some swelling and pain associated after the initial injury.  Pain is worse with palpation.  Denies any purulent drainage, fever, additional injury.  He does admit that he has been overusing his hand as he is doing work at home using tools.  Denies any numbness.  HPI     Past Medical History:  Diagnosis Date   Anxiety    Back pain    Depression    Hypertension     Patient Active Problem List   Diagnosis Date Noted   Hypertension    Sepsis (HCC)    Tobacco use disorder    Morbid obesity (HCC)    Hyponatremia    Depression with anxiety    Facial cellulitis 11/12/2020    Past Surgical History:  Procedure Laterality Date   VASCULAR SURGERY         No family history on file.  Social History   Tobacco Use   Smoking status: Every Day    Types: Cigarettes   Smokeless tobacco: Never  Vaping Use   Vaping Use: Never used  Substance Use Topics   Alcohol use: Never   Drug use: Never    Home Medications Prior to Admission medications   Medication Sig Start Date End Date Taking? Authorizing Provider  cephALEXin (KEFLEX) 500 MG capsule Take 1 capsule (500 mg total) by mouth 2 (two) times daily for 7 days. 03/25/21 04/01/21 Yes Favian Kittleson, PA-C  albuterol (VENTOLIN HFA) 108 (90 Base) MCG/ACT inhaler Inhale 1-2 puffs into the lungs every 6 (six) hours as needed for shortness of breath. 06/01/20   [provider]  amLODipine (NORVASC) 10 MG tablet Take 1 tablet by mouth  daily. 11/06/20   [provider]  carvedilol (COREG) 25 MG tablet Take 25 mg by mouth 2 (two) times daily. 09/06/20   [provider]  citalopram (CELEXA) 40 MG tablet Take 40 mg by mouth daily. 09/06/20   [provider]  hydrochlorothiazide (HYDRODIURIL) 12.5 MG tablet Take 12.5 mg by mouth daily. 09/06/20   [provider]  ibuprofen (ADVIL) 800 MG tablet Take 1 tablet (800 mg total) by mouth every 8 (eight) hours as needed for fever, headache or moderate pain. 11/16/20   Vassie Loll, MD  losartan (COZAAR) 25 MG tablet Take 25 mg by mouth daily. 09/06/20   [provider]  methocarbamol (ROBAXIN) 750 MG tablet Take 750 mg by mouth 4 (four) times daily. 11/06/20   [provider]  omeprazole (PRILOSEC) 40 MG capsule Take 1 capsule by mouth daily. 09/06/20   [provider]  pregabalin (LYRICA) 150 MG capsule Take 150 mg by mouth 2 (two) times daily. 10/16/20   [provider]    Allergies    Patient has no known allergies.  Review of Systems   Review of Systems  Constitutional:  Negative for chills and fever.  Skin:  Positive for wound.  Neurological:  Negative for weakness and  numbness.   Physical Exam Updated Vital Signs BP 126/74 (BP Location: Left Arm)   Pulse 82   Temp 98.7 F (37.1 C) (Oral)   Resp 20   Ht 6\' 4"  (1.93 m)   Wt (!) 179.6 kg   SpO2 98%   BMI 48.20 kg/m   Physical Exam Vitals and nursing note reviewed.  Constitutional:      General: He is not in acute distress.    Appearance: He is well-developed. He is not diaphoretic.  HENT:     Head: Normocephalic and atraumatic.  Eyes:     General: No scleral icterus.    Conjunctiva/sclera: Conjunctivae normal.  Pulmonary:     Effort: Pulmonary effort is normal. No respiratory distress.  Musculoskeletal:        General: Tenderness present.     Cervical back: Normal range of motion.     Comments: Some erythema noted surrounding the healing wound.   No purulent drainage.  Normal sensation to light touch of digits.  Normal range of motion of digits.  2+ radial pulse palpated bilaterally.  Skin:    Findings: No rash.  Neurological:     Mental Status: He is alert.     ED Results / Procedures / Treatments   Labs (all labs ordered are listed, but only abnormal results are displayed) Labs Reviewed - No data to display  EKG None  Radiology DG Hand Complete Right  Result Date: 03/25/2021 CLINICAL DATA:  Right hand swelling and pain. EXAM: RIGHT HAND - COMPLETE 3+ VIEW COMPARISON:  None. FINDINGS: There is diffuse soft tissue swelling of the hand. There is no radiopaque foreign body. There is no evidence for cortical erosion or periosteal reaction. There is no acute fracture or dislocation. There is a healed left fifth metacarpal neck fracture. IMPRESSION: 1. Diffuse soft tissue swelling of the right hand. 2. No acute bony abnormality. Electronically Signed   By: 05/25/2021 M.D.   On: 03/25/2021 22:36    Procedures Procedures   Medications Ordered in ED Medications - No data to display  ED Course  I have reviewed the triage vital signs and the nursing notes.  Pertinent labs & imaging results that were available during my care of the patient were reviewed by me and considered in my medical decision making (see chart for details).    MDM Rules/Calculators/A&P                           45 year old male presenting to the ED with pain in his right hand in the area of recent suture removal.  Initial injury was 9 days ago and sutures removed 2 days ago.  Woke up the next morning with pain and swelling.  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  Denies any purulent drainage, fever.  No additional injury since initial 1 9 days ago.  Physical exam findings noted above.  Air areas neurovascularly intact.  Will obtain imaging and  reassess.  X-ray shows soft tissue swelling without osseous abnormality.  Will cover with antibiotics for possible cellulitis.  Doubt vascular cause.  Wound is healing appropriately return precautions given.  All imaging, if done today, including plain films, CT scans, and ultrasounds, independently reviewed by me, and interpretations confirmed via formal radiology reads.  Patient is hemodynamically stable, in NAD, and able to ambulate in  the ED. Evaluation does not show pathology that would require ongoing emergent intervention or inpatient treatment. I explained the diagnosis to the patient. Pain has been managed and has no complaints prior to discharge. Patient is comfortable with above plan and is stable for discharge at this time. All questions were answered prior to disposition. Strict return precautions for returning to the ED were discussed. Encouraged follow up with PCP.   An After Visit Summary was printed and given to the patient.   Portions of this note were generated with Scientist, clinical (histocompatibility and immunogenetics). Dictation errors may occur despite best attempts at proofreading.  Final Clinical Impression(s) / ED Diagnoses Final diagnoses:  Cellulitis of right upper extremity    Rx / DC Orders ED Discharge Orders          Ordered    cephALEXin (KEFLEX) 500 MG capsule  2 times daily        03/25/21 2251             Dietrich Pates, PA-C 03/25/21 2253    Rolan Bucco, MD 03/25/21 2312

## 2021-03-25 NOTE — Discharge Instructions (Signed)
Take the antibiotics. Continue Tylenol or Motrin as needed for pain. Return to the ER if you start to experience worsening pain, redness, swelling or fever.

## 2021-09-05 IMAGING — CT CT NECK W/ CM
4 of 8 series · 12 of 33 positions shown, 13 images · IV contrast (Omnipaque)
Comparison: None.

CLINICAL DATA: Neck abscess. Lingual induration. Anterior neck
swelling. Left molar abscess.

EXAM:
CT NECK WITH CONTRAST
TECHNIQUE: Multidetector CT imaging of the neck was performed using the
standard protocol following the bolus administration of intravenous
contrast.
CONTRAST:  100mL OMNIPAQUE IOHEXOL 300 MG/ML  SOLN

[Series 3: axial neck · axial · 0.69mm/px · z∈[+692,+748]mm · 2 of 112 slices shown]
[im 28/112  bone]
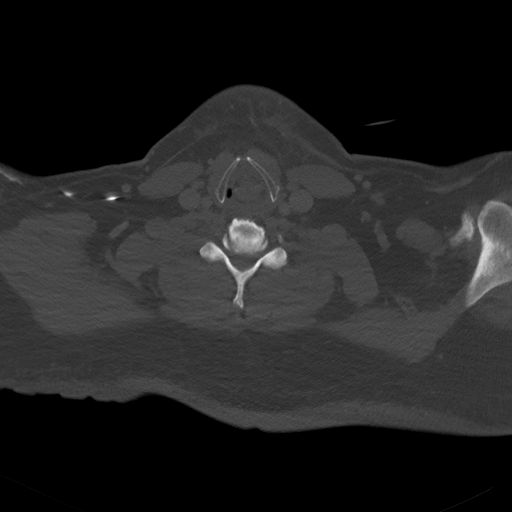
[im 56/112  bone]
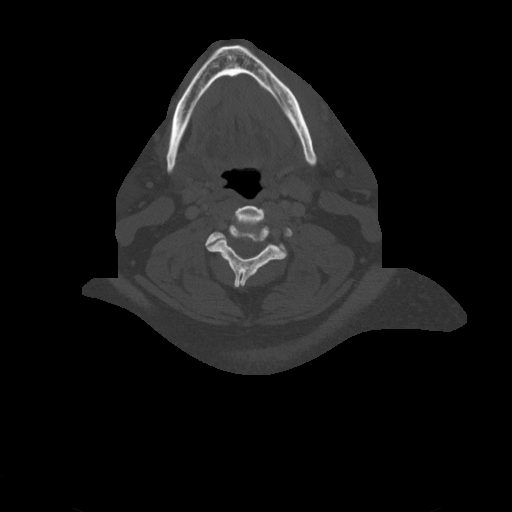

[Series 9: cor neck · coronal · 0.48mm/px · 1 of 140 slices shown]
[im 70/140  bone]
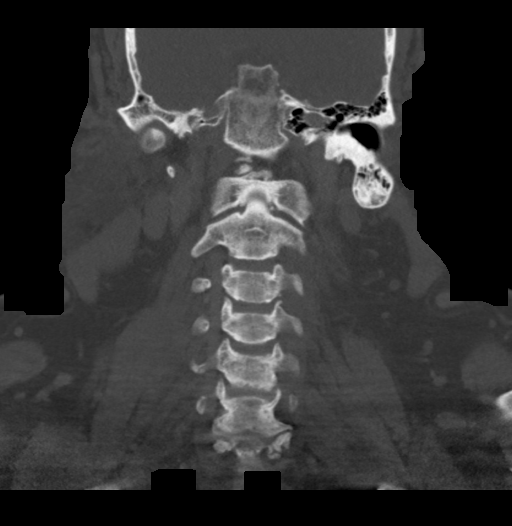

[Series 10: ax oropharynx · axial · 0.46mm/px · z∈[+652,+801]mm · 4 of 127 slices shown, 5 images]
[im 26/127  soft-tissue]
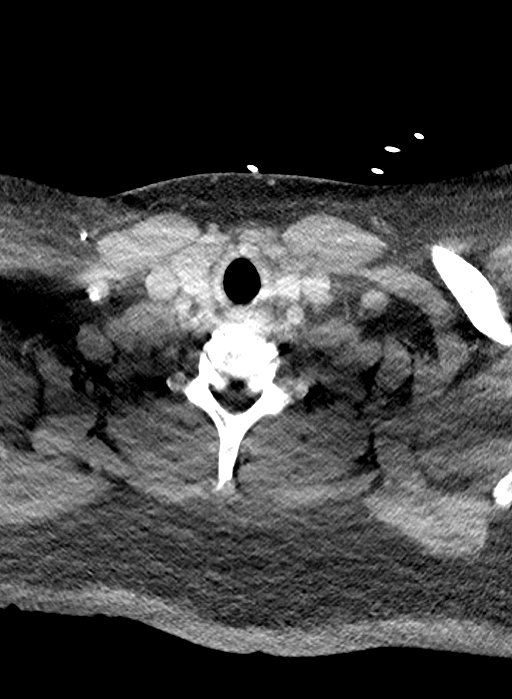
[im 26/127  bone]
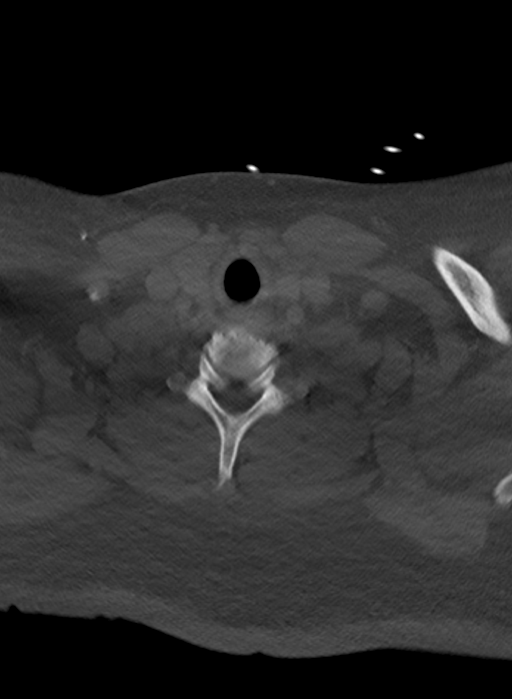
[im 51/127  bone]
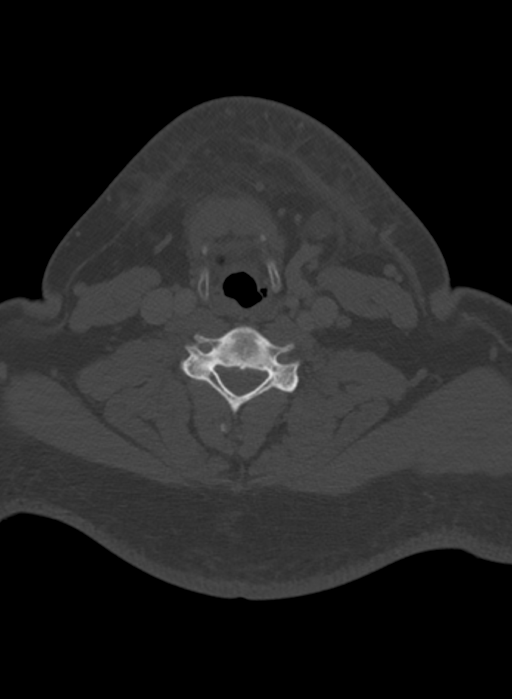
[im 76/127  bone]
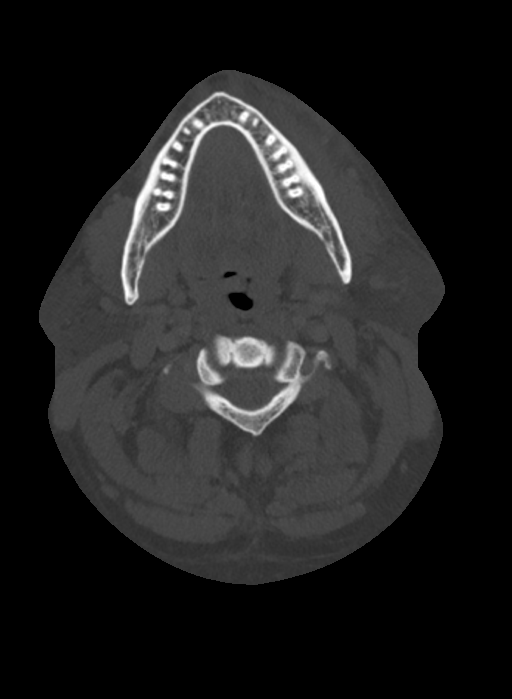
[im 101/127  bone]
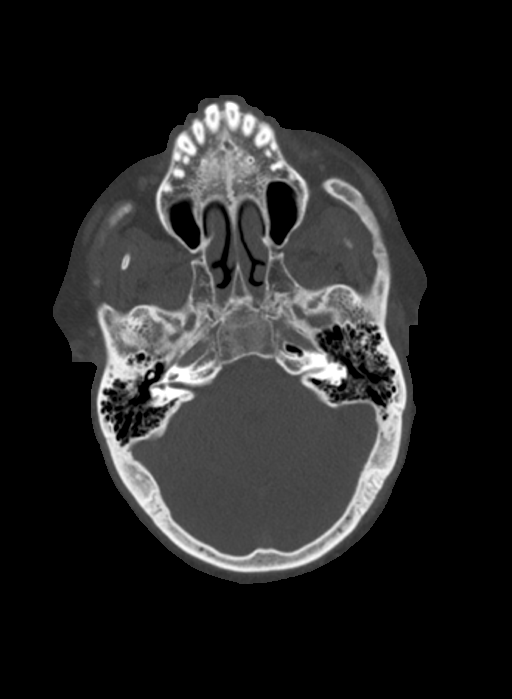

[Series 12: sag neck · sagittal · 0.28mm/px · 5 of 138 slices shown]
[im 23/138  bone]
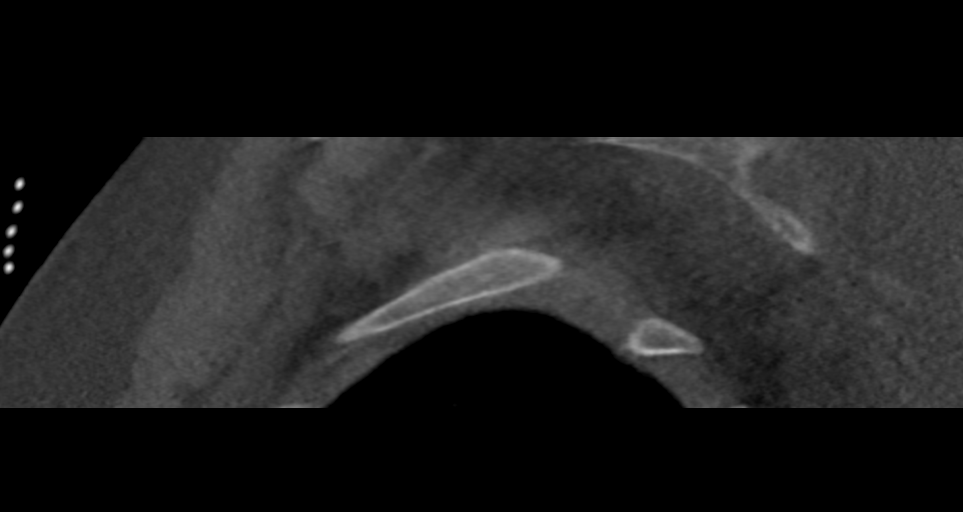
[im 46/138  bone]
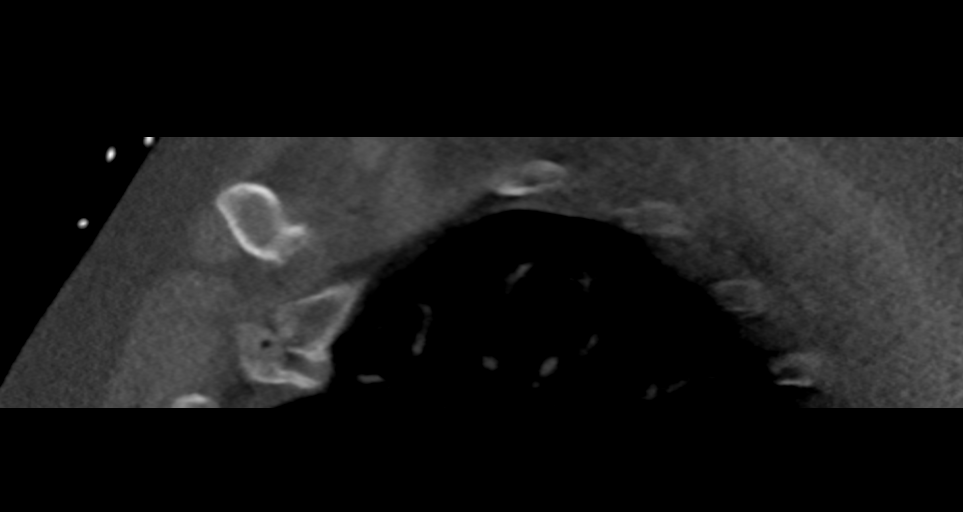
[im 69/138  bone]
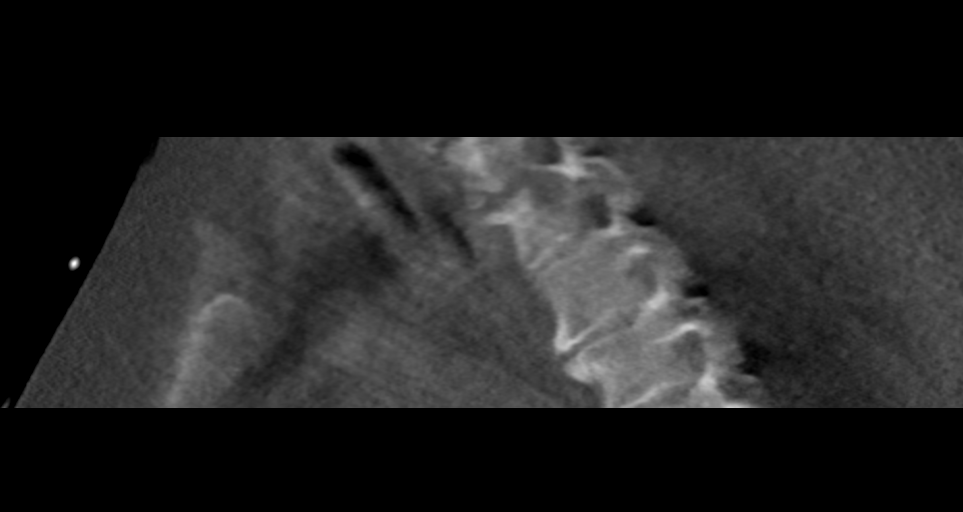
[im 92/138  bone]
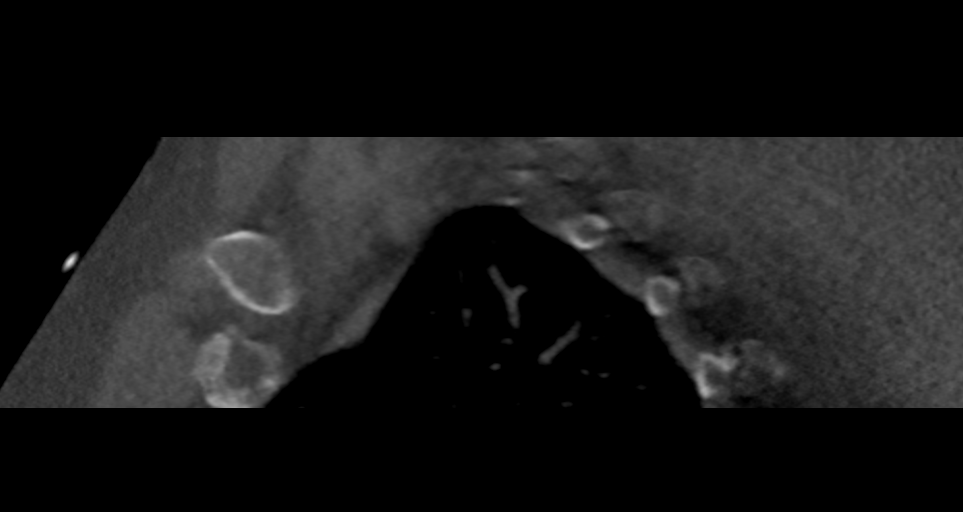
[im 115/138  bone]
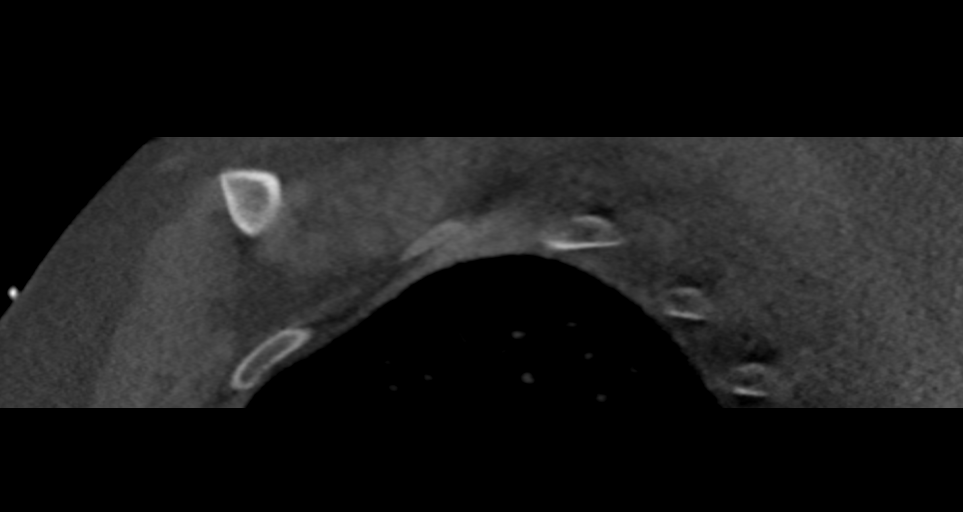

[12 of 33 positions shown; findings below may reference images not displayed]

FINDINGS: Pharynx and larynx: No primary mucosal or submucosal lesion
identified. Tongue appears grossly normal by CT.

Salivary glands: Parotid and submandibular glands are normal.

Thyroid: Normal

Lymph nodes: Mild reactive prominence of the lymph nodes on the
left. No suppuration.

Vascular: Normal

Limited intracranial: Normal

Visualized orbits: Normal

Mastoids and visualized paranasal sinuses: Clear

Skeleton: Maxillary dentition appears normal. Mandibular dentition
appears normal except for to 19 which shows a filling with some
adjacent lucency. No evidence of root disease.

Upper chest: Negative

Other: Left facial edema consistent with facial cellulitis. Swelling
in the region of the gingiva adjacent to the left mandible. As noted
above, there does not appear to be advanced periodontal disease by
imaging however. No sign of a drainable low-density collection.
IMPRESSION: Left facial cellulitis. Swelling in the region of the gingiva
adjacent to the left mandible. No sign of a drainable low-density
collection. No evidence of advanced periodontal disease. Dental
filling tooth 19 with some adjacent lucency.

## 2021-09-05 IMAGING — CT CT HEAD W/O CM
4 series · 15 of 47 positions shown, 17 images · non-contrast
Comparison: None.

CLINICAL DATA: Swelling of the left side of the face

EXAM:
CT HEAD WITHOUT CONTRAST
TECHNIQUE: Contiguous axial images were obtained from the base of the skull
through the vertex without intravenous contrast.

[Series 2: head wo · axial · 0.51mm/px · z∈[+794,+920]mm · 7 of 35 slices shown, 9 images]
[im 5/35  brain]
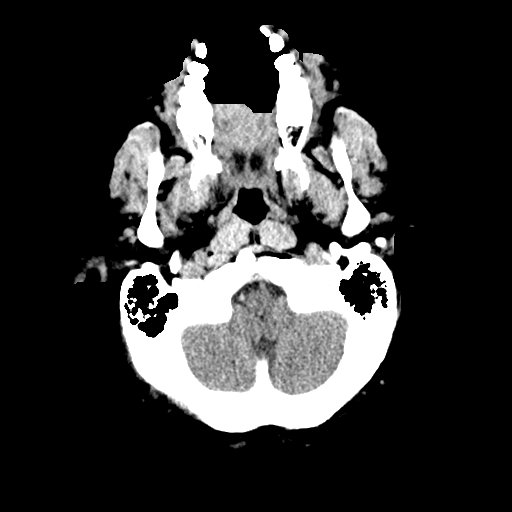
[im 5/35  bone]
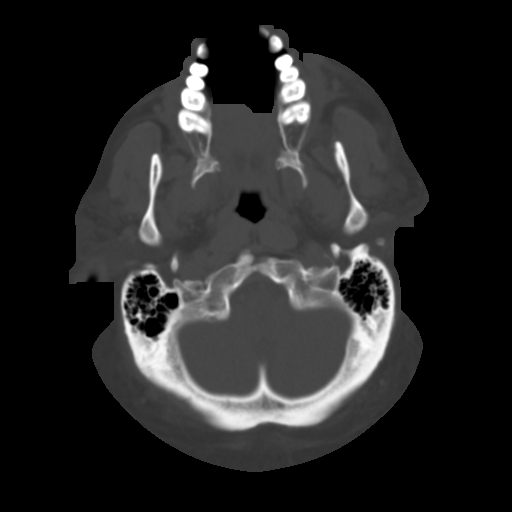
[im 9/35  brain]
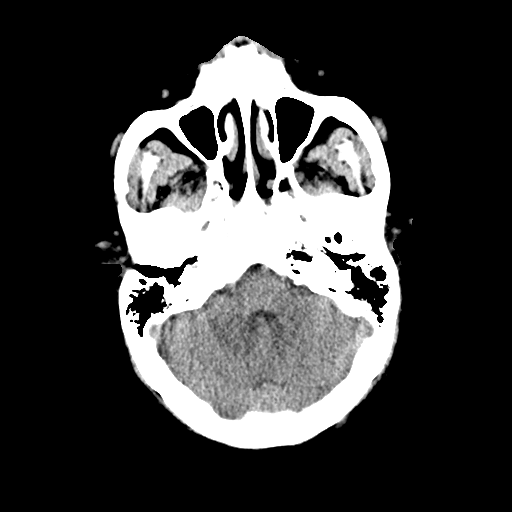
[im 13/35  brain]
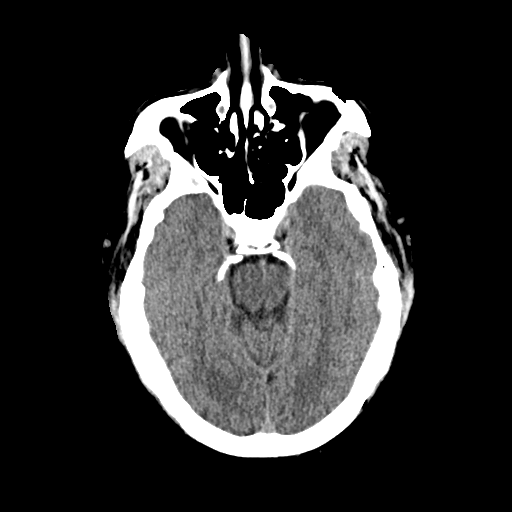
[im 18/35  brain]
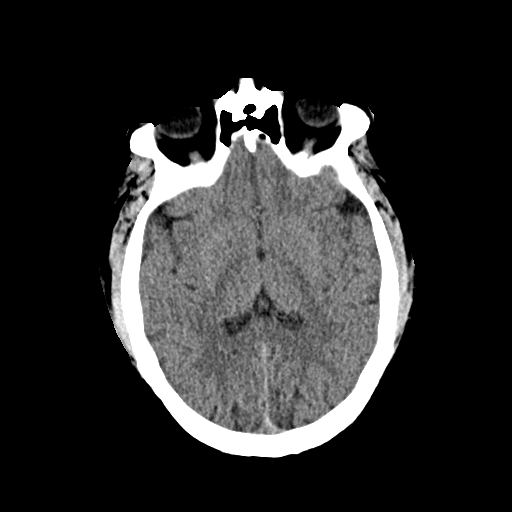
[im 22/35  brain]
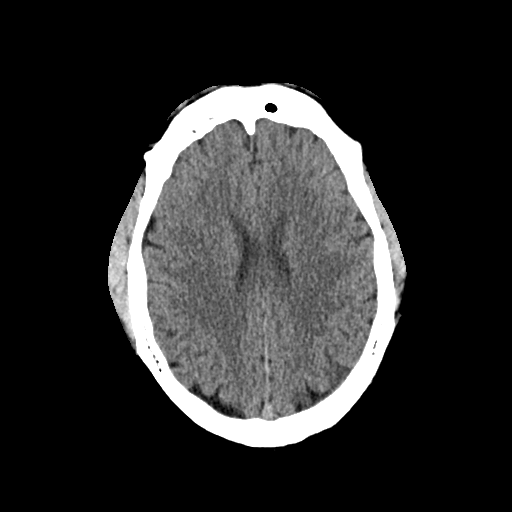
[im 22/35  bone]
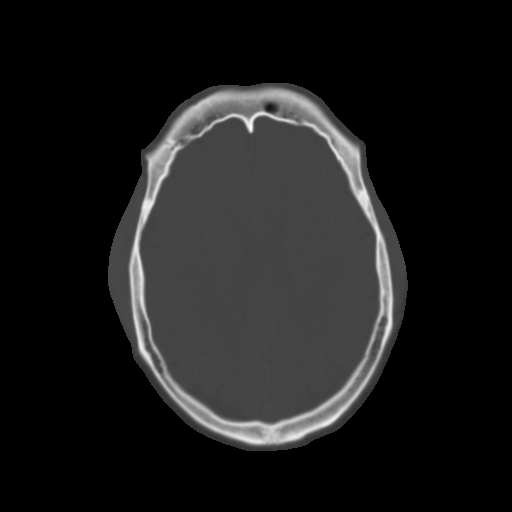
[im 26/35  brain]
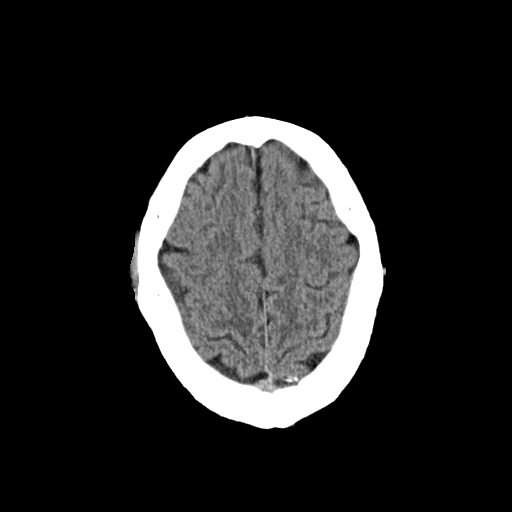
[im 30/35  brain]
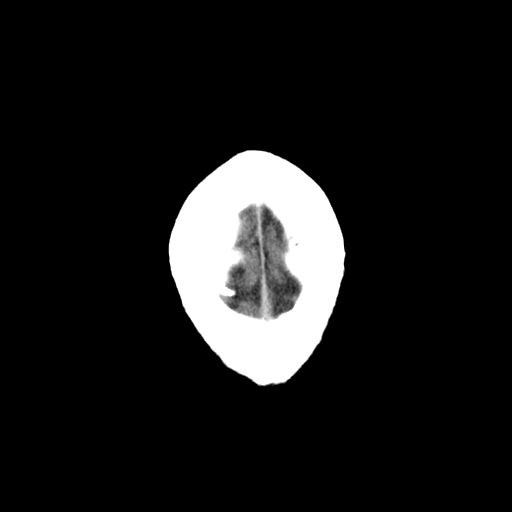

[Series 3: head bone · axial · 0.51mm/px · z∈[+790,+808]mm · 2 of 88 slices shown]
[im 9/88  bone]
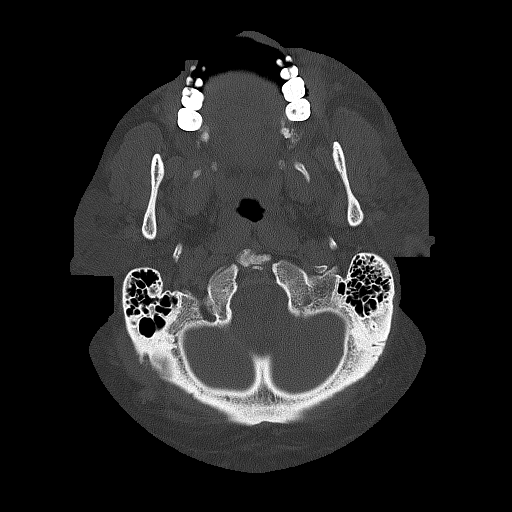
[im 18/88  bone]
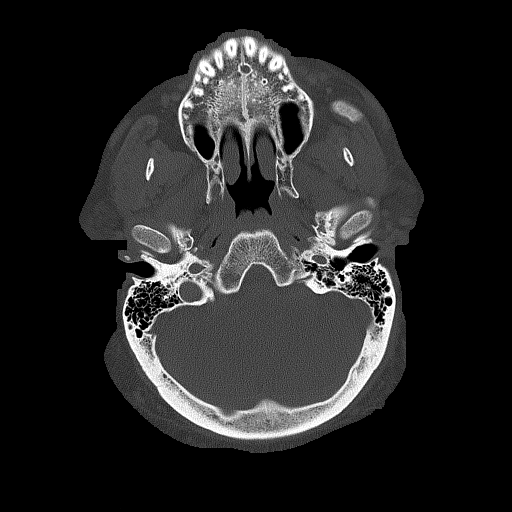

[Series 4: coronal soft · coronal · 0.34mm/px · 3 of 84 slices shown]
[im 28/84  brain]
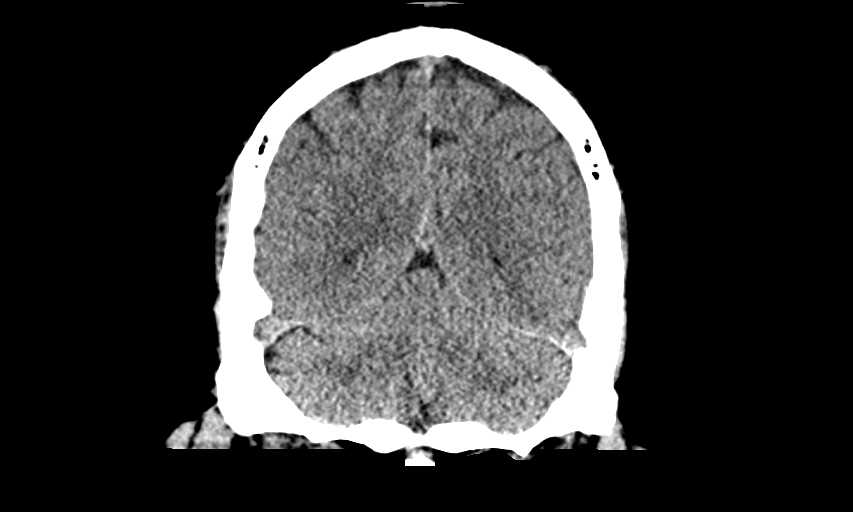
[im 37/84  brain]
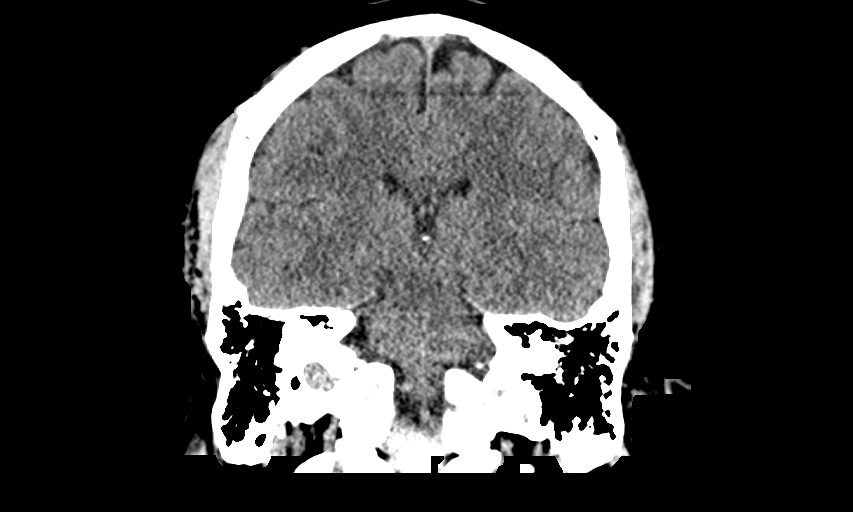
[im 47/84  brain]
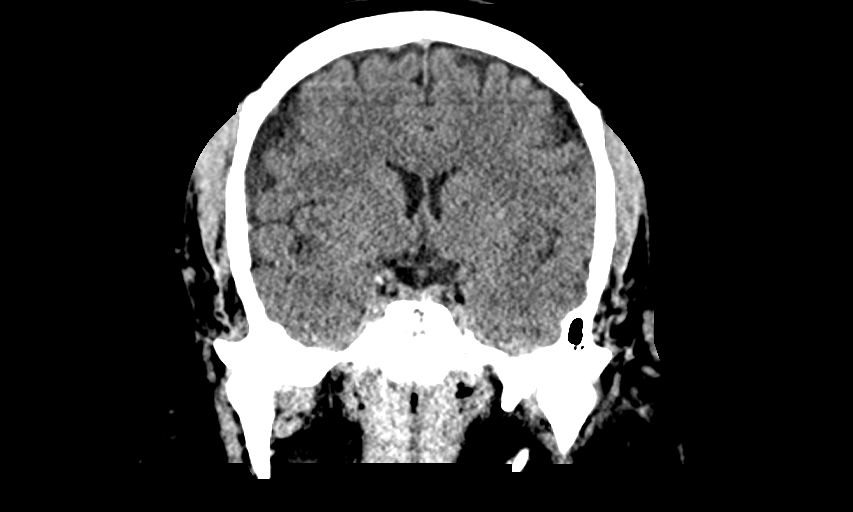

[Series 5: sag soft · sagittal · 0.35mm/px · 3 of 67 slices shown]
[im 23/67  brain]
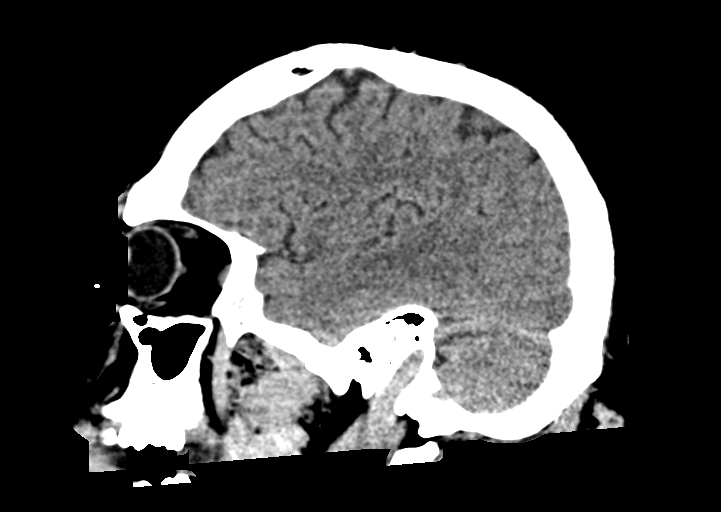
[im 34/67  brain]
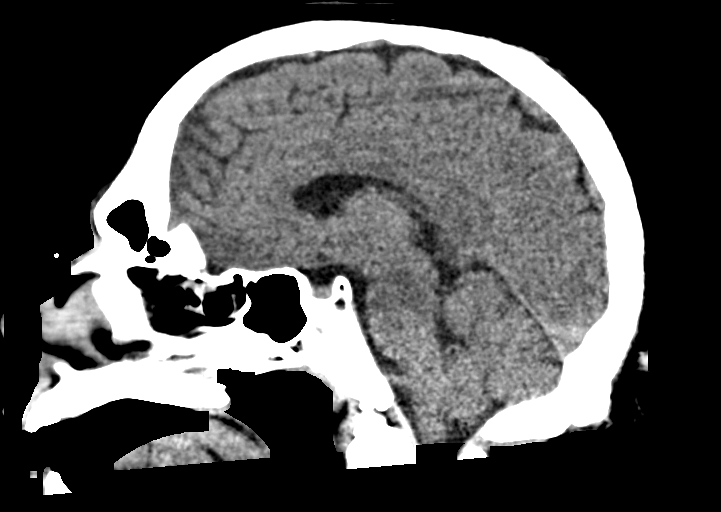
[im 45/67  brain]
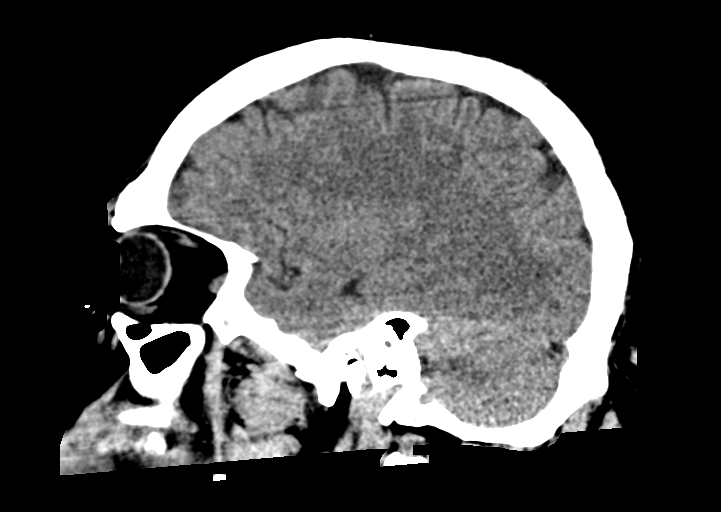

[15 of 47 positions shown; findings below may reference images not displayed]

FINDINGS: Brain: The brain shows a normal appearance without evidence of
malformation, atrophy, old or acute small or large vessel
infarction, mass lesion, hemorrhage, hydrocephalus or extra-axial
collection.

Vascular: No hyperdense vessel. No evidence of atherosclerotic
calcification.

Skull: Normal.  No traumatic finding.  No focal bone lesion.

Sinuses/Orbits: Sinuses are clear. Orbits appear normal. Mastoids
are clear.

Other: See results of neck CT.
IMPRESSION: 1. Normal head CT.
2. See results of neck CT.
